# Patient Record
Sex: Female | Born: 1940
Health system: Southern US, Community
[De-identification: ages and names within clinical notes are randomized; demographics above are authoritative.]

## PROBLEM LIST (undated history)

## (undated) HISTORY — PX: BREAST EXCISIONAL BIOPSY: SUR124

---

## 2013-10-04 DIAGNOSIS — L219 Seborrheic dermatitis, unspecified: Secondary | ICD-10-CM | POA: Diagnosis not present

## 2013-10-04 DIAGNOSIS — L821 Other seborrheic keratosis: Secondary | ICD-10-CM | POA: Diagnosis not present

## 2013-10-04 DIAGNOSIS — L259 Unspecified contact dermatitis, unspecified cause: Secondary | ICD-10-CM | POA: Diagnosis not present

## 2013-11-09 DIAGNOSIS — H34219 Partial retinal artery occlusion, unspecified eye: Secondary | ICD-10-CM | POA: Diagnosis not present

## 2013-11-09 DIAGNOSIS — H4011X0 Primary open-angle glaucoma, stage unspecified: Secondary | ICD-10-CM | POA: Diagnosis not present

## 2013-12-20 DIAGNOSIS — Z1231 Encounter for screening mammogram for malignant neoplasm of breast: Secondary | ICD-10-CM | POA: Diagnosis not present

## 2013-12-29 DIAGNOSIS — R635 Abnormal weight gain: Secondary | ICD-10-CM | POA: Diagnosis not present

## 2013-12-29 DIAGNOSIS — I10 Essential (primary) hypertension: Secondary | ICD-10-CM | POA: Diagnosis not present

## 2013-12-29 DIAGNOSIS — J309 Allergic rhinitis, unspecified: Secondary | ICD-10-CM | POA: Diagnosis not present

## 2013-12-29 DIAGNOSIS — Z23 Encounter for immunization: Secondary | ICD-10-CM | POA: Diagnosis not present

## 2013-12-29 DIAGNOSIS — H409 Unspecified glaucoma: Secondary | ICD-10-CM | POA: Diagnosis not present

## 2013-12-29 DIAGNOSIS — L299 Pruritus, unspecified: Secondary | ICD-10-CM | POA: Diagnosis not present

## 2013-12-29 DIAGNOSIS — M899 Disorder of bone, unspecified: Secondary | ICD-10-CM | POA: Diagnosis not present

## 2013-12-29 DIAGNOSIS — M949 Disorder of cartilage, unspecified: Secondary | ICD-10-CM | POA: Diagnosis not present

## 2014-01-04 DIAGNOSIS — Z780 Asymptomatic menopausal state: Secondary | ICD-10-CM | POA: Diagnosis not present

## 2014-01-04 DIAGNOSIS — Z13820 Encounter for screening for osteoporosis: Secondary | ICD-10-CM | POA: Diagnosis not present

## 2014-01-04 DIAGNOSIS — M949 Disorder of cartilage, unspecified: Secondary | ICD-10-CM | POA: Diagnosis not present

## 2014-01-04 DIAGNOSIS — Z8269 Family history of other diseases of the musculoskeletal system and connective tissue: Secondary | ICD-10-CM | POA: Diagnosis not present

## 2014-01-04 DIAGNOSIS — M899 Disorder of bone, unspecified: Secondary | ICD-10-CM | POA: Diagnosis not present

## 2014-01-11 DIAGNOSIS — H4011X0 Primary open-angle glaucoma, stage unspecified: Secondary | ICD-10-CM | POA: Diagnosis not present

## 2014-01-11 DIAGNOSIS — H5210 Myopia, unspecified eye: Secondary | ICD-10-CM | POA: Diagnosis not present

## 2014-01-11 DIAGNOSIS — D3100 Benign neoplasm of unspecified conjunctiva: Secondary | ICD-10-CM | POA: Diagnosis not present

## 2014-01-14 DIAGNOSIS — M67919 Unspecified disorder of synovium and tendon, unspecified shoulder: Secondary | ICD-10-CM | POA: Diagnosis not present

## 2014-01-14 DIAGNOSIS — M25519 Pain in unspecified shoulder: Secondary | ICD-10-CM | POA: Diagnosis not present

## 2014-01-14 DIAGNOSIS — IMO0002 Reserved for concepts with insufficient information to code with codable children: Secondary | ICD-10-CM | POA: Diagnosis not present

## 2014-01-14 DIAGNOSIS — M719 Bursopathy, unspecified: Secondary | ICD-10-CM | POA: Diagnosis not present

## 2014-01-14 DIAGNOSIS — M19019 Primary osteoarthritis, unspecified shoulder: Secondary | ICD-10-CM | POA: Diagnosis not present

## 2014-01-17 DIAGNOSIS — IMO0002 Reserved for concepts with insufficient information to code with codable children: Secondary | ICD-10-CM | POA: Diagnosis not present

## 2014-01-17 DIAGNOSIS — M25519 Pain in unspecified shoulder: Secondary | ICD-10-CM | POA: Diagnosis not present

## 2014-01-19 DIAGNOSIS — IMO0002 Reserved for concepts with insufficient information to code with codable children: Secondary | ICD-10-CM | POA: Diagnosis not present

## 2014-01-19 DIAGNOSIS — M25519 Pain in unspecified shoulder: Secondary | ICD-10-CM | POA: Diagnosis not present

## 2014-01-24 DIAGNOSIS — M25519 Pain in unspecified shoulder: Secondary | ICD-10-CM | POA: Diagnosis not present

## 2014-01-24 DIAGNOSIS — IMO0002 Reserved for concepts with insufficient information to code with codable children: Secondary | ICD-10-CM | POA: Diagnosis not present

## 2014-02-07 DIAGNOSIS — M25519 Pain in unspecified shoulder: Secondary | ICD-10-CM | POA: Diagnosis not present

## 2014-02-07 DIAGNOSIS — IMO0002 Reserved for concepts with insufficient information to code with codable children: Secondary | ICD-10-CM | POA: Diagnosis not present

## 2014-02-11 DIAGNOSIS — IMO0002 Reserved for concepts with insufficient information to code with codable children: Secondary | ICD-10-CM | POA: Diagnosis not present

## 2014-02-11 DIAGNOSIS — M25519 Pain in unspecified shoulder: Secondary | ICD-10-CM | POA: Diagnosis not present

## 2014-02-14 DIAGNOSIS — IMO0002 Reserved for concepts with insufficient information to code with codable children: Secondary | ICD-10-CM | POA: Diagnosis not present

## 2014-02-14 DIAGNOSIS — M25519 Pain in unspecified shoulder: Secondary | ICD-10-CM | POA: Diagnosis not present

## 2014-02-18 DIAGNOSIS — IMO0002 Reserved for concepts with insufficient information to code with codable children: Secondary | ICD-10-CM | POA: Diagnosis not present

## 2014-02-18 DIAGNOSIS — M25519 Pain in unspecified shoulder: Secondary | ICD-10-CM | POA: Diagnosis not present

## 2014-02-23 DIAGNOSIS — IMO0002 Reserved for concepts with insufficient information to code with codable children: Secondary | ICD-10-CM | POA: Diagnosis not present

## 2014-02-23 DIAGNOSIS — M25519 Pain in unspecified shoulder: Secondary | ICD-10-CM | POA: Diagnosis not present

## 2014-02-28 DIAGNOSIS — IMO0002 Reserved for concepts with insufficient information to code with codable children: Secondary | ICD-10-CM | POA: Diagnosis not present

## 2014-02-28 DIAGNOSIS — M25519 Pain in unspecified shoulder: Secondary | ICD-10-CM | POA: Diagnosis not present

## 2014-03-03 DIAGNOSIS — IMO0002 Reserved for concepts with insufficient information to code with codable children: Secondary | ICD-10-CM | POA: Diagnosis not present

## 2014-03-03 DIAGNOSIS — M25519 Pain in unspecified shoulder: Secondary | ICD-10-CM | POA: Diagnosis not present

## 2014-03-07 DIAGNOSIS — IMO0002 Reserved for concepts with insufficient information to code with codable children: Secondary | ICD-10-CM | POA: Diagnosis not present

## 2014-03-07 DIAGNOSIS — M25519 Pain in unspecified shoulder: Secondary | ICD-10-CM | POA: Diagnosis not present

## 2014-03-09 DIAGNOSIS — M25519 Pain in unspecified shoulder: Secondary | ICD-10-CM | POA: Diagnosis not present

## 2014-03-09 DIAGNOSIS — IMO0002 Reserved for concepts with insufficient information to code with codable children: Secondary | ICD-10-CM | POA: Diagnosis not present

## 2014-03-14 DIAGNOSIS — IMO0002 Reserved for concepts with insufficient information to code with codable children: Secondary | ICD-10-CM | POA: Diagnosis not present

## 2014-03-14 DIAGNOSIS — M25519 Pain in unspecified shoulder: Secondary | ICD-10-CM | POA: Diagnosis not present

## 2014-03-16 DIAGNOSIS — M25519 Pain in unspecified shoulder: Secondary | ICD-10-CM | POA: Diagnosis not present

## 2014-03-16 DIAGNOSIS — IMO0002 Reserved for concepts with insufficient information to code with codable children: Secondary | ICD-10-CM | POA: Diagnosis not present

## 2014-03-21 DIAGNOSIS — L821 Other seborrheic keratosis: Secondary | ICD-10-CM | POA: Diagnosis not present

## 2014-03-21 DIAGNOSIS — D1801 Hemangioma of skin and subcutaneous tissue: Secondary | ICD-10-CM | POA: Diagnosis not present

## 2014-03-21 DIAGNOSIS — L258 Unspecified contact dermatitis due to other agents: Secondary | ICD-10-CM | POA: Diagnosis not present

## 2014-03-21 DIAGNOSIS — Z85820 Personal history of malignant melanoma of skin: Secondary | ICD-10-CM | POA: Diagnosis not present

## 2014-03-21 DIAGNOSIS — L570 Actinic keratosis: Secondary | ICD-10-CM | POA: Diagnosis not present

## 2014-03-21 DIAGNOSIS — D485 Neoplasm of uncertain behavior of skin: Secondary | ICD-10-CM | POA: Diagnosis not present

## 2014-03-22 DIAGNOSIS — H53429 Scotoma of blind spot area, unspecified eye: Secondary | ICD-10-CM | POA: Diagnosis not present

## 2014-03-22 DIAGNOSIS — H4011X0 Primary open-angle glaucoma, stage unspecified: Secondary | ICD-10-CM | POA: Diagnosis not present

## 2014-03-22 DIAGNOSIS — H35039 Hypertensive retinopathy, unspecified eye: Secondary | ICD-10-CM | POA: Diagnosis not present

## 2014-03-22 DIAGNOSIS — H359 Unspecified retinal disorder: Secondary | ICD-10-CM | POA: Diagnosis not present

## 2014-03-23 DIAGNOSIS — S43429A Sprain of unspecified rotator cuff capsule, initial encounter: Secondary | ICD-10-CM | POA: Diagnosis not present

## 2014-03-29 DIAGNOSIS — L570 Actinic keratosis: Secondary | ICD-10-CM | POA: Diagnosis not present

## 2014-03-29 DIAGNOSIS — L678 Other hair color and hair shaft abnormalities: Secondary | ICD-10-CM | POA: Diagnosis not present

## 2014-03-29 DIAGNOSIS — L738 Other specified follicular disorders: Secondary | ICD-10-CM | POA: Diagnosis not present

## 2014-04-08 DIAGNOSIS — S46819A Strain of other muscles, fascia and tendons at shoulder and upper arm level, unspecified arm, initial encounter: Secondary | ICD-10-CM | POA: Diagnosis not present

## 2014-04-13 DIAGNOSIS — Z01818 Encounter for other preprocedural examination: Secondary | ICD-10-CM | POA: Diagnosis not present

## 2014-04-13 DIAGNOSIS — M25819 Other specified joint disorders, unspecified shoulder: Secondary | ICD-10-CM | POA: Diagnosis not present

## 2014-04-13 DIAGNOSIS — M19019 Primary osteoarthritis, unspecified shoulder: Secondary | ICD-10-CM | POA: Diagnosis not present

## 2014-04-13 DIAGNOSIS — S43429A Sprain of unspecified rotator cuff capsule, initial encounter: Secondary | ICD-10-CM | POA: Diagnosis not present

## 2014-04-15 DIAGNOSIS — S43429A Sprain of unspecified rotator cuff capsule, initial encounter: Secondary | ICD-10-CM | POA: Diagnosis not present

## 2014-04-15 DIAGNOSIS — M25819 Other specified joint disorders, unspecified shoulder: Secondary | ICD-10-CM | POA: Diagnosis not present

## 2014-04-15 DIAGNOSIS — M25519 Pain in unspecified shoulder: Secondary | ICD-10-CM | POA: Diagnosis not present

## 2014-04-15 DIAGNOSIS — M19019 Primary osteoarthritis, unspecified shoulder: Secondary | ICD-10-CM | POA: Diagnosis not present

## 2014-04-19 DIAGNOSIS — R9431 Abnormal electrocardiogram [ECG] [EKG]: Secondary | ICD-10-CM | POA: Diagnosis not present

## 2014-04-19 DIAGNOSIS — Z01810 Encounter for preprocedural cardiovascular examination: Secondary | ICD-10-CM | POA: Diagnosis not present

## 2014-04-19 DIAGNOSIS — Z01818 Encounter for other preprocedural examination: Secondary | ICD-10-CM | POA: Diagnosis not present

## 2014-04-19 DIAGNOSIS — S43429A Sprain of unspecified rotator cuff capsule, initial encounter: Secondary | ICD-10-CM | POA: Diagnosis not present

## 2014-04-21 DIAGNOSIS — S43439A Superior glenoid labrum lesion of unspecified shoulder, initial encounter: Secondary | ICD-10-CM | POA: Diagnosis not present

## 2014-04-21 DIAGNOSIS — M75120 Complete rotator cuff tear or rupture of unspecified shoulder, not specified as traumatic: Secondary | ICD-10-CM | POA: Diagnosis not present

## 2014-04-21 DIAGNOSIS — M24019 Loose body in unspecified shoulder: Secondary | ICD-10-CM | POA: Diagnosis not present

## 2014-04-21 DIAGNOSIS — M19019 Primary osteoarthritis, unspecified shoulder: Secondary | ICD-10-CM | POA: Diagnosis not present

## 2014-04-21 DIAGNOSIS — S43429A Sprain of unspecified rotator cuff capsule, initial encounter: Secondary | ICD-10-CM | POA: Diagnosis not present

## 2014-04-21 DIAGNOSIS — M25819 Other specified joint disorders, unspecified shoulder: Secondary | ICD-10-CM | POA: Diagnosis not present

## 2014-04-21 DIAGNOSIS — G8918 Other acute postprocedural pain: Secondary | ICD-10-CM | POA: Diagnosis not present

## 2014-04-27 DIAGNOSIS — S43429A Sprain of unspecified rotator cuff capsule, initial encounter: Secondary | ICD-10-CM | POA: Diagnosis not present

## 2014-04-27 DIAGNOSIS — M25519 Pain in unspecified shoulder: Secondary | ICD-10-CM | POA: Diagnosis not present

## 2014-05-02 DIAGNOSIS — M25519 Pain in unspecified shoulder: Secondary | ICD-10-CM | POA: Diagnosis not present

## 2014-05-02 DIAGNOSIS — S43429A Sprain of unspecified rotator cuff capsule, initial encounter: Secondary | ICD-10-CM | POA: Diagnosis not present

## 2014-05-06 DIAGNOSIS — S43429A Sprain of unspecified rotator cuff capsule, initial encounter: Secondary | ICD-10-CM | POA: Diagnosis not present

## 2014-05-06 DIAGNOSIS — M25519 Pain in unspecified shoulder: Secondary | ICD-10-CM | POA: Diagnosis not present

## 2014-05-10 DIAGNOSIS — M25519 Pain in unspecified shoulder: Secondary | ICD-10-CM | POA: Diagnosis not present

## 2014-05-10 DIAGNOSIS — S43429A Sprain of unspecified rotator cuff capsule, initial encounter: Secondary | ICD-10-CM | POA: Diagnosis not present

## 2014-05-12 DIAGNOSIS — S43429A Sprain of unspecified rotator cuff capsule, initial encounter: Secondary | ICD-10-CM | POA: Diagnosis not present

## 2014-05-12 DIAGNOSIS — M25519 Pain in unspecified shoulder: Secondary | ICD-10-CM | POA: Diagnosis not present

## 2014-05-16 DIAGNOSIS — M25519 Pain in unspecified shoulder: Secondary | ICD-10-CM | POA: Diagnosis not present

## 2014-05-16 DIAGNOSIS — S43429A Sprain of unspecified rotator cuff capsule, initial encounter: Secondary | ICD-10-CM | POA: Diagnosis not present

## 2014-05-19 DIAGNOSIS — M25519 Pain in unspecified shoulder: Secondary | ICD-10-CM | POA: Diagnosis not present

## 2014-05-19 DIAGNOSIS — S43429A Sprain of unspecified rotator cuff capsule, initial encounter: Secondary | ICD-10-CM | POA: Diagnosis not present

## 2014-05-19 DIAGNOSIS — L570 Actinic keratosis: Secondary | ICD-10-CM | POA: Diagnosis not present

## 2014-05-23 DIAGNOSIS — S43429A Sprain of unspecified rotator cuff capsule, initial encounter: Secondary | ICD-10-CM | POA: Diagnosis not present

## 2014-05-23 DIAGNOSIS — M25519 Pain in unspecified shoulder: Secondary | ICD-10-CM | POA: Diagnosis not present

## 2014-06-07 DIAGNOSIS — S43429A Sprain of unspecified rotator cuff capsule, initial encounter: Secondary | ICD-10-CM | POA: Diagnosis not present

## 2014-06-07 DIAGNOSIS — M25519 Pain in unspecified shoulder: Secondary | ICD-10-CM | POA: Diagnosis not present

## 2014-06-09 DIAGNOSIS — M25519 Pain in unspecified shoulder: Secondary | ICD-10-CM | POA: Diagnosis not present

## 2014-06-09 DIAGNOSIS — S43429A Sprain of unspecified rotator cuff capsule, initial encounter: Secondary | ICD-10-CM | POA: Diagnosis not present

## 2014-06-14 DIAGNOSIS — S43429A Sprain of unspecified rotator cuff capsule, initial encounter: Secondary | ICD-10-CM | POA: Diagnosis not present

## 2014-06-14 DIAGNOSIS — M25519 Pain in unspecified shoulder: Secondary | ICD-10-CM | POA: Diagnosis not present

## 2014-06-16 DIAGNOSIS — M25519 Pain in unspecified shoulder: Secondary | ICD-10-CM | POA: Diagnosis not present

## 2014-06-16 DIAGNOSIS — S43429A Sprain of unspecified rotator cuff capsule, initial encounter: Secondary | ICD-10-CM | POA: Diagnosis not present

## 2014-06-17 DIAGNOSIS — Z1211 Encounter for screening for malignant neoplasm of colon: Secondary | ICD-10-CM | POA: Diagnosis not present

## 2014-06-17 DIAGNOSIS — Z5181 Encounter for therapeutic drug level monitoring: Secondary | ICD-10-CM | POA: Diagnosis not present

## 2014-06-17 DIAGNOSIS — D126 Benign neoplasm of colon, unspecified: Secondary | ICD-10-CM | POA: Diagnosis not present

## 2014-06-17 DIAGNOSIS — K5289 Other specified noninfective gastroenteritis and colitis: Secondary | ICD-10-CM | POA: Diagnosis not present

## 2014-06-17 DIAGNOSIS — K5190 Ulcerative colitis, unspecified, without complications: Secondary | ICD-10-CM | POA: Diagnosis not present

## 2014-06-21 DIAGNOSIS — M25519 Pain in unspecified shoulder: Secondary | ICD-10-CM | POA: Diagnosis not present

## 2014-06-21 DIAGNOSIS — S43429A Sprain of unspecified rotator cuff capsule, initial encounter: Secondary | ICD-10-CM | POA: Diagnosis not present

## 2014-06-22 DIAGNOSIS — N3000 Acute cystitis without hematuria: Secondary | ICD-10-CM | POA: Diagnosis not present

## 2014-06-22 DIAGNOSIS — R300 Dysuria: Secondary | ICD-10-CM | POA: Diagnosis not present

## 2014-06-22 DIAGNOSIS — Z23 Encounter for immunization: Secondary | ICD-10-CM | POA: Diagnosis not present

## 2014-06-23 DIAGNOSIS — S43429A Sprain of unspecified rotator cuff capsule, initial encounter: Secondary | ICD-10-CM | POA: Diagnosis not present

## 2014-06-23 DIAGNOSIS — M25519 Pain in unspecified shoulder: Secondary | ICD-10-CM | POA: Diagnosis not present

## 2014-06-28 DIAGNOSIS — S43429A Sprain of unspecified rotator cuff capsule, initial encounter: Secondary | ICD-10-CM | POA: Diagnosis not present

## 2014-06-28 DIAGNOSIS — M25519 Pain in unspecified shoulder: Secondary | ICD-10-CM | POA: Diagnosis not present

## 2014-06-28 DIAGNOSIS — IMO0002 Reserved for concepts with insufficient information to code with codable children: Secondary | ICD-10-CM | POA: Diagnosis not present

## 2014-06-29 DIAGNOSIS — M654 Radial styloid tenosynovitis [de Quervain]: Secondary | ICD-10-CM | POA: Diagnosis not present

## 2014-06-29 DIAGNOSIS — M19049 Primary osteoarthritis, unspecified hand: Secondary | ICD-10-CM | POA: Diagnosis not present

## 2014-06-30 DIAGNOSIS — M25512 Pain in left shoulder: Secondary | ICD-10-CM | POA: Diagnosis not present

## 2014-06-30 DIAGNOSIS — S43422A Sprain of left rotator cuff capsule, initial encounter: Secondary | ICD-10-CM | POA: Diagnosis not present

## 2014-07-12 DIAGNOSIS — M25512 Pain in left shoulder: Secondary | ICD-10-CM | POA: Diagnosis not present

## 2014-07-12 DIAGNOSIS — S43422D Sprain of left rotator cuff capsule, subsequent encounter: Secondary | ICD-10-CM | POA: Diagnosis not present

## 2014-07-13 DIAGNOSIS — H4011X2 Primary open-angle glaucoma, moderate stage: Secondary | ICD-10-CM | POA: Diagnosis not present

## 2014-07-15 DIAGNOSIS — S43422D Sprain of left rotator cuff capsule, subsequent encounter: Secondary | ICD-10-CM | POA: Diagnosis not present

## 2014-07-15 DIAGNOSIS — M25512 Pain in left shoulder: Secondary | ICD-10-CM | POA: Diagnosis not present

## 2014-07-18 DIAGNOSIS — S43422D Sprain of left rotator cuff capsule, subsequent encounter: Secondary | ICD-10-CM | POA: Diagnosis not present

## 2014-07-18 DIAGNOSIS — M25512 Pain in left shoulder: Secondary | ICD-10-CM | POA: Diagnosis not present

## 2014-07-18 DIAGNOSIS — S43422A Sprain of left rotator cuff capsule, initial encounter: Secondary | ICD-10-CM | POA: Diagnosis not present

## 2014-07-26 DIAGNOSIS — S43422D Sprain of left rotator cuff capsule, subsequent encounter: Secondary | ICD-10-CM | POA: Diagnosis not present

## 2014-07-26 DIAGNOSIS — M25512 Pain in left shoulder: Secondary | ICD-10-CM | POA: Diagnosis not present

## 2014-07-28 DIAGNOSIS — M25512 Pain in left shoulder: Secondary | ICD-10-CM | POA: Diagnosis not present

## 2014-07-28 DIAGNOSIS — S43422D Sprain of left rotator cuff capsule, subsequent encounter: Secondary | ICD-10-CM | POA: Diagnosis not present

## 2014-08-08 DIAGNOSIS — S43422D Sprain of left rotator cuff capsule, subsequent encounter: Secondary | ICD-10-CM | POA: Diagnosis not present

## 2014-08-08 DIAGNOSIS — M25512 Pain in left shoulder: Secondary | ICD-10-CM | POA: Diagnosis not present

## 2014-08-10 DIAGNOSIS — M25512 Pain in left shoulder: Secondary | ICD-10-CM | POA: Diagnosis not present

## 2014-08-10 DIAGNOSIS — S43422D Sprain of left rotator cuff capsule, subsequent encounter: Secondary | ICD-10-CM | POA: Diagnosis not present

## 2014-08-16 DIAGNOSIS — M25512 Pain in left shoulder: Secondary | ICD-10-CM | POA: Diagnosis not present

## 2014-08-16 DIAGNOSIS — S43422D Sprain of left rotator cuff capsule, subsequent encounter: Secondary | ICD-10-CM | POA: Diagnosis not present

## 2014-08-17 DIAGNOSIS — M25512 Pain in left shoulder: Secondary | ICD-10-CM | POA: Diagnosis not present

## 2014-08-17 DIAGNOSIS — S43422D Sprain of left rotator cuff capsule, subsequent encounter: Secondary | ICD-10-CM | POA: Diagnosis not present

## 2014-09-16 DIAGNOSIS — L570 Actinic keratosis: Secondary | ICD-10-CM | POA: Diagnosis not present

## 2014-09-16 DIAGNOSIS — D1801 Hemangioma of skin and subcutaneous tissue: Secondary | ICD-10-CM | POA: Diagnosis not present

## 2014-09-16 DIAGNOSIS — Z08 Encounter for follow-up examination after completed treatment for malignant neoplasm: Secondary | ICD-10-CM | POA: Diagnosis not present

## 2014-09-16 DIAGNOSIS — L218 Other seborrheic dermatitis: Secondary | ICD-10-CM | POA: Diagnosis not present

## 2014-09-16 DIAGNOSIS — L680 Hirsutism: Secondary | ICD-10-CM | POA: Diagnosis not present

## 2014-10-17 DIAGNOSIS — M25511 Pain in right shoulder: Secondary | ICD-10-CM | POA: Diagnosis not present

## 2014-10-17 DIAGNOSIS — M19011 Primary osteoarthritis, right shoulder: Secondary | ICD-10-CM | POA: Diagnosis not present

## 2014-10-17 DIAGNOSIS — S43422D Sprain of left rotator cuff capsule, subsequent encounter: Secondary | ICD-10-CM | POA: Diagnosis not present

## 2014-10-17 DIAGNOSIS — Z09 Encounter for follow-up examination after completed treatment for conditions other than malignant neoplasm: Secondary | ICD-10-CM | POA: Diagnosis not present

## 2014-11-28 DIAGNOSIS — H918X2 Other specified hearing loss, left ear: Secondary | ICD-10-CM | POA: Diagnosis not present

## 2014-11-28 DIAGNOSIS — H9312 Tinnitus, left ear: Secondary | ICD-10-CM | POA: Diagnosis not present

## 2014-11-28 DIAGNOSIS — H903 Sensorineural hearing loss, bilateral: Secondary | ICD-10-CM | POA: Diagnosis not present

## 2014-11-28 DIAGNOSIS — H608X3 Other otitis externa, bilateral: Secondary | ICD-10-CM | POA: Diagnosis not present

## 2014-11-28 DIAGNOSIS — H905 Unspecified sensorineural hearing loss: Secondary | ICD-10-CM | POA: Diagnosis not present

## 2014-12-15 DIAGNOSIS — H905 Unspecified sensorineural hearing loss: Secondary | ICD-10-CM | POA: Diagnosis not present

## 2014-12-16 ENCOUNTER — Other Ambulatory Visit: Payer: Self-pay | Admitting: Otolaryngology

## 2014-12-16 DIAGNOSIS — H905 Unspecified sensorineural hearing loss: Secondary | ICD-10-CM

## 2014-12-16 DIAGNOSIS — H903 Sensorineural hearing loss, bilateral: Secondary | ICD-10-CM

## 2014-12-20 DIAGNOSIS — H2512 Age-related nuclear cataract, left eye: Secondary | ICD-10-CM | POA: Diagnosis not present

## 2014-12-20 DIAGNOSIS — H524 Presbyopia: Secondary | ICD-10-CM | POA: Diagnosis not present

## 2014-12-20 DIAGNOSIS — H4011X2 Primary open-angle glaucoma, moderate stage: Secondary | ICD-10-CM | POA: Diagnosis not present

## 2014-12-30 DIAGNOSIS — R921 Mammographic calcification found on diagnostic imaging of breast: Secondary | ICD-10-CM | POA: Diagnosis not present

## 2014-12-30 DIAGNOSIS — E559 Vitamin D deficiency, unspecified: Secondary | ICD-10-CM | POA: Diagnosis not present

## 2014-12-30 DIAGNOSIS — Z0000 Encounter for general adult medical examination without abnormal findings: Secondary | ICD-10-CM | POA: Diagnosis not present

## 2014-12-30 DIAGNOSIS — Z9889 Other specified postprocedural states: Secondary | ICD-10-CM | POA: Diagnosis not present

## 2014-12-30 DIAGNOSIS — I10 Essential (primary) hypertension: Secondary | ICD-10-CM | POA: Diagnosis not present

## 2014-12-30 DIAGNOSIS — K529 Noninfective gastroenteritis and colitis, unspecified: Secondary | ICD-10-CM | POA: Diagnosis not present

## 2014-12-30 DIAGNOSIS — Z8639 Personal history of other endocrine, nutritional and metabolic disease: Secondary | ICD-10-CM | POA: Diagnosis not present

## 2014-12-30 DIAGNOSIS — Z1231 Encounter for screening mammogram for malignant neoplasm of breast: Secondary | ICD-10-CM | POA: Diagnosis not present

## 2014-12-31 DIAGNOSIS — Z882 Allergy status to sulfonamides status: Secondary | ICD-10-CM | POA: Diagnosis not present

## 2014-12-31 DIAGNOSIS — R079 Chest pain, unspecified: Secondary | ICD-10-CM | POA: Diagnosis not present

## 2014-12-31 DIAGNOSIS — Z87891 Personal history of nicotine dependence: Secondary | ICD-10-CM | POA: Diagnosis not present

## 2014-12-31 DIAGNOSIS — Z85828 Personal history of other malignant neoplasm of skin: Secondary | ICD-10-CM | POA: Diagnosis not present

## 2014-12-31 DIAGNOSIS — I10 Essential (primary) hypertension: Secondary | ICD-10-CM | POA: Diagnosis not present

## 2014-12-31 DIAGNOSIS — R1031 Right lower quadrant pain: Secondary | ICD-10-CM | POA: Diagnosis not present

## 2014-12-31 DIAGNOSIS — R109 Unspecified abdominal pain: Secondary | ICD-10-CM | POA: Diagnosis not present

## 2014-12-31 DIAGNOSIS — S20212A Contusion of left front wall of thorax, initial encounter: Secondary | ICD-10-CM | POA: Diagnosis not present

## 2014-12-31 DIAGNOSIS — T1490 Injury, unspecified: Secondary | ICD-10-CM | POA: Diagnosis not present

## 2014-12-31 DIAGNOSIS — Z888 Allergy status to other drugs, medicaments and biological substances status: Secondary | ICD-10-CM | POA: Diagnosis not present

## 2014-12-31 DIAGNOSIS — Z91040 Latex allergy status: Secondary | ICD-10-CM | POA: Diagnosis not present

## 2014-12-31 DIAGNOSIS — R0781 Pleurodynia: Secondary | ICD-10-CM | POA: Diagnosis not present

## 2014-12-31 DIAGNOSIS — S2002XA Contusion of left breast, initial encounter: Secondary | ICD-10-CM | POA: Diagnosis not present

## 2014-12-31 DIAGNOSIS — R1012 Left upper quadrant pain: Secondary | ICD-10-CM | POA: Diagnosis not present

## 2015-01-06 ENCOUNTER — Ambulatory Visit
Admission: RE | Admit: 2015-01-06 | Discharge: 2015-01-06 | Disposition: A | Discharge status: Home / Self Care | Payer: Medicare Other | Source: Ambulatory Visit | Attending: Otolaryngology | Admitting: Otolaryngology

## 2015-01-06 DIAGNOSIS — H905 Unspecified sensorineural hearing loss: Secondary | ICD-10-CM

## 2015-01-06 DIAGNOSIS — H903 Sensorineural hearing loss, bilateral: Secondary | ICD-10-CM

## 2015-01-06 DIAGNOSIS — H9192 Unspecified hearing loss, left ear: Secondary | ICD-10-CM | POA: Diagnosis not present

## 2015-01-06 MED ORDER — GADOBENATE DIMEGLUMINE 529 MG/ML IV SOLN
7.0000 mL | Freq: Once | INTRAVENOUS | Status: AC | PRN
  Administered 2015-01-06: 7 mL via INTRAVENOUS

## 2015-02-24 DIAGNOSIS — K635 Polyp of colon: Secondary | ICD-10-CM | POA: Diagnosis not present

## 2015-02-24 DIAGNOSIS — I10 Essential (primary) hypertension: Secondary | ICD-10-CM | POA: Diagnosis not present

## 2015-02-24 DIAGNOSIS — Z85820 Personal history of malignant melanoma of skin: Secondary | ICD-10-CM | POA: Diagnosis not present

## 2015-02-24 DIAGNOSIS — H409 Unspecified glaucoma: Secondary | ICD-10-CM | POA: Diagnosis not present

## 2015-03-09 DIAGNOSIS — D2372 Other benign neoplasm of skin of left lower limb, including hip: Secondary | ICD-10-CM | POA: Diagnosis not present

## 2015-03-09 DIAGNOSIS — D1801 Hemangioma of skin and subcutaneous tissue: Secondary | ICD-10-CM | POA: Diagnosis not present

## 2015-03-09 DIAGNOSIS — L812 Freckles: Secondary | ICD-10-CM | POA: Diagnosis not present

## 2015-03-09 DIAGNOSIS — L218 Other seborrheic dermatitis: Secondary | ICD-10-CM | POA: Diagnosis not present

## 2015-03-09 DIAGNOSIS — L821 Other seborrheic keratosis: Secondary | ICD-10-CM | POA: Diagnosis not present

## 2015-03-09 DIAGNOSIS — Z85820 Personal history of malignant melanoma of skin: Secondary | ICD-10-CM | POA: Diagnosis not present

## 2015-03-09 DIAGNOSIS — L680 Hirsutism: Secondary | ICD-10-CM | POA: Diagnosis not present

## 2015-03-28 DIAGNOSIS — H4011X2 Primary open-angle glaucoma, moderate stage: Secondary | ICD-10-CM | POA: Diagnosis not present

## 2015-03-31 DIAGNOSIS — K5790 Diverticulosis of intestine, part unspecified, without perforation or abscess without bleeding: Secondary | ICD-10-CM | POA: Diagnosis not present

## 2015-03-31 DIAGNOSIS — R1032 Left lower quadrant pain: Secondary | ICD-10-CM | POA: Diagnosis not present

## 2015-06-26 DIAGNOSIS — Z23 Encounter for immunization: Secondary | ICD-10-CM | POA: Diagnosis not present

## 2015-07-03 DIAGNOSIS — H43391 Other vitreous opacities, right eye: Secondary | ICD-10-CM | POA: Diagnosis not present

## 2015-07-03 DIAGNOSIS — B5800 Toxoplasma oculopathy, unspecified: Secondary | ICD-10-CM | POA: Diagnosis not present

## 2015-07-07 DIAGNOSIS — N390 Urinary tract infection, site not specified: Secondary | ICD-10-CM | POA: Diagnosis not present

## 2015-07-07 DIAGNOSIS — R319 Hematuria, unspecified: Secondary | ICD-10-CM | POA: Diagnosis not present

## 2015-07-31 DIAGNOSIS — H401132 Primary open-angle glaucoma, bilateral, moderate stage: Secondary | ICD-10-CM | POA: Diagnosis not present

## 2015-08-30 DIAGNOSIS — Z1389 Encounter for screening for other disorder: Secondary | ICD-10-CM | POA: Diagnosis not present

## 2015-08-30 DIAGNOSIS — Z0000 Encounter for general adult medical examination without abnormal findings: Secondary | ICD-10-CM | POA: Diagnosis not present

## 2015-08-30 DIAGNOSIS — M12819 Other specific arthropathies, not elsewhere classified, unspecified shoulder: Secondary | ICD-10-CM | POA: Diagnosis not present

## 2015-08-30 DIAGNOSIS — Z136 Encounter for screening for cardiovascular disorders: Secondary | ICD-10-CM | POA: Diagnosis not present

## 2015-08-30 DIAGNOSIS — Z23 Encounter for immunization: Secondary | ICD-10-CM | POA: Diagnosis not present

## 2015-08-30 DIAGNOSIS — M179 Osteoarthritis of knee, unspecified: Secondary | ICD-10-CM | POA: Diagnosis not present

## 2015-08-30 DIAGNOSIS — I10 Essential (primary) hypertension: Secondary | ICD-10-CM | POA: Diagnosis not present

## 2015-09-01 DIAGNOSIS — L308 Other specified dermatitis: Secondary | ICD-10-CM | POA: Diagnosis not present

## 2015-09-01 DIAGNOSIS — D2261 Melanocytic nevi of right upper limb, including shoulder: Secondary | ICD-10-CM | POA: Diagnosis not present

## 2015-09-01 DIAGNOSIS — Z85820 Personal history of malignant melanoma of skin: Secondary | ICD-10-CM | POA: Diagnosis not present

## 2015-09-01 DIAGNOSIS — D1801 Hemangioma of skin and subcutaneous tissue: Secondary | ICD-10-CM | POA: Diagnosis not present

## 2015-09-01 DIAGNOSIS — L821 Other seborrheic keratosis: Secondary | ICD-10-CM | POA: Diagnosis not present

## 2015-09-01 DIAGNOSIS — L918 Other hypertrophic disorders of the skin: Secondary | ICD-10-CM | POA: Diagnosis not present

## 2015-09-01 DIAGNOSIS — L603 Nail dystrophy: Secondary | ICD-10-CM | POA: Diagnosis not present

## 2015-09-01 DIAGNOSIS — L245 Irritant contact dermatitis due to other chemical products: Secondary | ICD-10-CM | POA: Diagnosis not present

## 2015-09-01 DIAGNOSIS — L218 Other seborrheic dermatitis: Secondary | ICD-10-CM | POA: Diagnosis not present

## 2015-09-11 DIAGNOSIS — M25511 Pain in right shoulder: Secondary | ICD-10-CM | POA: Diagnosis not present

## 2015-09-11 DIAGNOSIS — M25561 Pain in right knee: Secondary | ICD-10-CM | POA: Diagnosis not present

## 2015-09-11 DIAGNOSIS — M25512 Pain in left shoulder: Secondary | ICD-10-CM | POA: Diagnosis not present

## 2015-09-11 DIAGNOSIS — M25562 Pain in left knee: Secondary | ICD-10-CM | POA: Diagnosis not present

## 2015-09-14 ENCOUNTER — Other Ambulatory Visit: Payer: Self-pay | Admitting: Internal Medicine

## 2015-09-14 DIAGNOSIS — M25512 Pain in left shoulder: Secondary | ICD-10-CM | POA: Diagnosis not present

## 2015-09-14 DIAGNOSIS — M25511 Pain in right shoulder: Secondary | ICD-10-CM | POA: Diagnosis not present

## 2015-09-14 DIAGNOSIS — M25562 Pain in left knee: Secondary | ICD-10-CM | POA: Diagnosis not present

## 2015-09-14 DIAGNOSIS — M79662 Pain in left lower leg: Secondary | ICD-10-CM

## 2015-09-14 DIAGNOSIS — M79661 Pain in right lower leg: Secondary | ICD-10-CM

## 2015-09-14 DIAGNOSIS — M25561 Pain in right knee: Secondary | ICD-10-CM | POA: Diagnosis not present

## 2015-09-15 DIAGNOSIS — M7122 Synovial cyst of popliteal space [Baker], left knee: Secondary | ICD-10-CM | POA: Diagnosis not present

## 2015-09-15 DIAGNOSIS — M7121 Synovial cyst of popliteal space [Baker], right knee: Secondary | ICD-10-CM | POA: Diagnosis not present

## 2015-09-18 DIAGNOSIS — M25511 Pain in right shoulder: Secondary | ICD-10-CM | POA: Diagnosis not present

## 2015-09-18 DIAGNOSIS — M25512 Pain in left shoulder: Secondary | ICD-10-CM | POA: Diagnosis not present

## 2015-09-18 DIAGNOSIS — M25561 Pain in right knee: Secondary | ICD-10-CM | POA: Diagnosis not present

## 2015-09-18 DIAGNOSIS — M25562 Pain in left knee: Secondary | ICD-10-CM | POA: Diagnosis not present

## 2015-09-20 ENCOUNTER — Other Ambulatory Visit: Payer: Medicare Other

## 2015-09-20 DIAGNOSIS — M25562 Pain in left knee: Secondary | ICD-10-CM | POA: Diagnosis not present

## 2015-09-20 DIAGNOSIS — M25561 Pain in right knee: Secondary | ICD-10-CM | POA: Diagnosis not present

## 2015-09-20 DIAGNOSIS — M25511 Pain in right shoulder: Secondary | ICD-10-CM | POA: Diagnosis not present

## 2015-09-20 DIAGNOSIS — M25512 Pain in left shoulder: Secondary | ICD-10-CM | POA: Diagnosis not present

## 2015-09-28 DIAGNOSIS — M25561 Pain in right knee: Secondary | ICD-10-CM | POA: Diagnosis not present

## 2015-09-28 DIAGNOSIS — M25562 Pain in left knee: Secondary | ICD-10-CM | POA: Diagnosis not present

## 2015-09-28 DIAGNOSIS — M25512 Pain in left shoulder: Secondary | ICD-10-CM | POA: Diagnosis not present

## 2015-09-28 DIAGNOSIS — M25511 Pain in right shoulder: Secondary | ICD-10-CM | POA: Diagnosis not present

## 2015-09-29 DIAGNOSIS — M25562 Pain in left knee: Secondary | ICD-10-CM | POA: Diagnosis not present

## 2015-09-29 DIAGNOSIS — M25511 Pain in right shoulder: Secondary | ICD-10-CM | POA: Diagnosis not present

## 2015-09-29 DIAGNOSIS — M25512 Pain in left shoulder: Secondary | ICD-10-CM | POA: Diagnosis not present

## 2015-09-29 DIAGNOSIS — M25561 Pain in right knee: Secondary | ICD-10-CM | POA: Diagnosis not present

## 2015-10-03 DIAGNOSIS — M25562 Pain in left knee: Secondary | ICD-10-CM | POA: Diagnosis not present

## 2015-10-03 DIAGNOSIS — M25511 Pain in right shoulder: Secondary | ICD-10-CM | POA: Diagnosis not present

## 2015-10-03 DIAGNOSIS — M25512 Pain in left shoulder: Secondary | ICD-10-CM | POA: Diagnosis not present

## 2015-10-03 DIAGNOSIS — M25561 Pain in right knee: Secondary | ICD-10-CM | POA: Diagnosis not present

## 2015-10-05 DIAGNOSIS — M25562 Pain in left knee: Secondary | ICD-10-CM | POA: Diagnosis not present

## 2015-10-05 DIAGNOSIS — M25511 Pain in right shoulder: Secondary | ICD-10-CM | POA: Diagnosis not present

## 2015-10-05 DIAGNOSIS — M25512 Pain in left shoulder: Secondary | ICD-10-CM | POA: Diagnosis not present

## 2015-10-05 DIAGNOSIS — M25561 Pain in right knee: Secondary | ICD-10-CM | POA: Diagnosis not present

## 2015-10-12 DIAGNOSIS — M25561 Pain in right knee: Secondary | ICD-10-CM | POA: Diagnosis not present

## 2015-10-12 DIAGNOSIS — M25512 Pain in left shoulder: Secondary | ICD-10-CM | POA: Diagnosis not present

## 2015-10-12 DIAGNOSIS — M25562 Pain in left knee: Secondary | ICD-10-CM | POA: Diagnosis not present

## 2015-10-12 DIAGNOSIS — M25511 Pain in right shoulder: Secondary | ICD-10-CM | POA: Diagnosis not present

## 2015-10-17 DIAGNOSIS — M25561 Pain in right knee: Secondary | ICD-10-CM | POA: Diagnosis not present

## 2015-10-17 DIAGNOSIS — M25512 Pain in left shoulder: Secondary | ICD-10-CM | POA: Diagnosis not present

## 2015-10-17 DIAGNOSIS — M25511 Pain in right shoulder: Secondary | ICD-10-CM | POA: Diagnosis not present

## 2015-10-17 DIAGNOSIS — M25562 Pain in left knee: Secondary | ICD-10-CM | POA: Diagnosis not present

## 2015-10-19 DIAGNOSIS — M25511 Pain in right shoulder: Secondary | ICD-10-CM | POA: Diagnosis not present

## 2015-10-19 DIAGNOSIS — M25562 Pain in left knee: Secondary | ICD-10-CM | POA: Diagnosis not present

## 2015-10-19 DIAGNOSIS — M25512 Pain in left shoulder: Secondary | ICD-10-CM | POA: Diagnosis not present

## 2015-10-19 DIAGNOSIS — M25561 Pain in right knee: Secondary | ICD-10-CM | POA: Diagnosis not present

## 2015-10-23 DIAGNOSIS — M25512 Pain in left shoulder: Secondary | ICD-10-CM | POA: Diagnosis not present

## 2015-10-23 DIAGNOSIS — M25562 Pain in left knee: Secondary | ICD-10-CM | POA: Diagnosis not present

## 2015-10-23 DIAGNOSIS — M25511 Pain in right shoulder: Secondary | ICD-10-CM | POA: Diagnosis not present

## 2015-10-23 DIAGNOSIS — M25561 Pain in right knee: Secondary | ICD-10-CM | POA: Diagnosis not present

## 2015-10-26 DIAGNOSIS — M25562 Pain in left knee: Secondary | ICD-10-CM | POA: Diagnosis not present

## 2015-10-26 DIAGNOSIS — M25561 Pain in right knee: Secondary | ICD-10-CM | POA: Diagnosis not present

## 2015-10-26 DIAGNOSIS — M25512 Pain in left shoulder: Secondary | ICD-10-CM | POA: Diagnosis not present

## 2015-10-26 DIAGNOSIS — M25511 Pain in right shoulder: Secondary | ICD-10-CM | POA: Diagnosis not present

## 2015-11-18 DIAGNOSIS — R05 Cough: Secondary | ICD-10-CM | POA: Diagnosis not present

## 2015-11-18 DIAGNOSIS — J3089 Other allergic rhinitis: Secondary | ICD-10-CM | POA: Diagnosis not present

## 2015-11-20 DIAGNOSIS — J209 Acute bronchitis, unspecified: Secondary | ICD-10-CM | POA: Diagnosis not present

## 2015-11-29 ENCOUNTER — Other Ambulatory Visit: Payer: Self-pay | Admitting: Internal Medicine

## 2015-11-29 DIAGNOSIS — M79602 Pain in left arm: Secondary | ICD-10-CM | POA: Diagnosis not present

## 2015-11-29 DIAGNOSIS — J209 Acute bronchitis, unspecified: Secondary | ICD-10-CM | POA: Diagnosis not present

## 2015-11-29 DIAGNOSIS — Z1231 Encounter for screening mammogram for malignant neoplasm of breast: Secondary | ICD-10-CM

## 2016-01-02 ENCOUNTER — Ambulatory Visit
Admission: RE | Admit: 2016-01-02 | Discharge: 2016-01-02 | Disposition: A | Discharge status: Home / Self Care | Payer: Medicare Other | Source: Ambulatory Visit | Attending: Internal Medicine | Admitting: Internal Medicine

## 2016-01-02 DIAGNOSIS — Z1231 Encounter for screening mammogram for malignant neoplasm of breast: Secondary | ICD-10-CM | POA: Diagnosis not present

## 2016-01-25 DIAGNOSIS — M75102 Unspecified rotator cuff tear or rupture of left shoulder, not specified as traumatic: Secondary | ICD-10-CM | POA: Diagnosis not present

## 2016-01-25 DIAGNOSIS — M7542 Impingement syndrome of left shoulder: Secondary | ICD-10-CM | POA: Diagnosis not present

## 2016-01-25 DIAGNOSIS — M25512 Pain in left shoulder: Secondary | ICD-10-CM | POA: Diagnosis not present

## 2016-01-25 DIAGNOSIS — M7522 Bicipital tendinitis, left shoulder: Secondary | ICD-10-CM | POA: Diagnosis not present

## 2016-02-05 DIAGNOSIS — M7542 Impingement syndrome of left shoulder: Secondary | ICD-10-CM | POA: Diagnosis not present

## 2016-02-08 DIAGNOSIS — M7542 Impingement syndrome of left shoulder: Secondary | ICD-10-CM | POA: Diagnosis not present

## 2016-02-12 DIAGNOSIS — M7542 Impingement syndrome of left shoulder: Secondary | ICD-10-CM | POA: Diagnosis not present

## 2016-02-14 DIAGNOSIS — M7542 Impingement syndrome of left shoulder: Secondary | ICD-10-CM | POA: Diagnosis not present

## 2016-02-15 DIAGNOSIS — M1812 Unilateral primary osteoarthritis of first carpometacarpal joint, left hand: Secondary | ICD-10-CM | POA: Diagnosis not present

## 2016-02-19 DIAGNOSIS — M7542 Impingement syndrome of left shoulder: Secondary | ICD-10-CM | POA: Diagnosis not present

## 2016-02-20 DIAGNOSIS — H401132 Primary open-angle glaucoma, bilateral, moderate stage: Secondary | ICD-10-CM | POA: Diagnosis not present

## 2016-02-21 DIAGNOSIS — M7542 Impingement syndrome of left shoulder: Secondary | ICD-10-CM | POA: Diagnosis not present

## 2016-02-22 DIAGNOSIS — M25512 Pain in left shoulder: Secondary | ICD-10-CM | POA: Diagnosis not present

## 2016-02-22 DIAGNOSIS — M25511 Pain in right shoulder: Secondary | ICD-10-CM | POA: Diagnosis not present

## 2016-02-22 DIAGNOSIS — M7541 Impingement syndrome of right shoulder: Secondary | ICD-10-CM | POA: Diagnosis not present

## 2016-02-22 DIAGNOSIS — M75102 Unspecified rotator cuff tear or rupture of left shoulder, not specified as traumatic: Secondary | ICD-10-CM | POA: Diagnosis not present

## 2016-02-27 DIAGNOSIS — I10 Essential (primary) hypertension: Secondary | ICD-10-CM | POA: Diagnosis not present

## 2016-02-27 DIAGNOSIS — Z8639 Personal history of other endocrine, nutritional and metabolic disease: Secondary | ICD-10-CM | POA: Diagnosis not present

## 2016-02-28 DIAGNOSIS — M7542 Impingement syndrome of left shoulder: Secondary | ICD-10-CM | POA: Diagnosis not present

## 2016-03-06 DIAGNOSIS — M7541 Impingement syndrome of right shoulder: Secondary | ICD-10-CM | POA: Diagnosis not present

## 2016-03-08 DIAGNOSIS — M7542 Impingement syndrome of left shoulder: Secondary | ICD-10-CM | POA: Diagnosis not present

## 2016-03-08 DIAGNOSIS — D2371 Other benign neoplasm of skin of right lower limb, including hip: Secondary | ICD-10-CM | POA: Diagnosis not present

## 2016-03-08 DIAGNOSIS — D1801 Hemangioma of skin and subcutaneous tissue: Secondary | ICD-10-CM | POA: Diagnosis not present

## 2016-03-08 DIAGNOSIS — L821 Other seborrheic keratosis: Secondary | ICD-10-CM | POA: Diagnosis not present

## 2016-03-08 DIAGNOSIS — D2261 Melanocytic nevi of right upper limb, including shoulder: Secondary | ICD-10-CM | POA: Diagnosis not present

## 2016-03-08 DIAGNOSIS — Z85820 Personal history of malignant melanoma of skin: Secondary | ICD-10-CM | POA: Diagnosis not present

## 2016-03-12 DIAGNOSIS — M7542 Impingement syndrome of left shoulder: Secondary | ICD-10-CM | POA: Diagnosis not present

## 2016-03-15 DIAGNOSIS — M7542 Impingement syndrome of left shoulder: Secondary | ICD-10-CM | POA: Diagnosis not present

## 2016-03-19 DIAGNOSIS — M7542 Impingement syndrome of left shoulder: Secondary | ICD-10-CM | POA: Diagnosis not present

## 2016-03-21 DIAGNOSIS — M25512 Pain in left shoulder: Secondary | ICD-10-CM | POA: Diagnosis not present

## 2016-03-21 DIAGNOSIS — M7541 Impingement syndrome of right shoulder: Secondary | ICD-10-CM | POA: Diagnosis not present

## 2016-03-21 DIAGNOSIS — M25511 Pain in right shoulder: Secondary | ICD-10-CM | POA: Diagnosis not present

## 2016-03-21 DIAGNOSIS — M7542 Impingement syndrome of left shoulder: Secondary | ICD-10-CM | POA: Diagnosis not present

## 2016-03-22 DIAGNOSIS — M7542 Impingement syndrome of left shoulder: Secondary | ICD-10-CM | POA: Diagnosis not present

## 2016-03-29 DIAGNOSIS — M7542 Impingement syndrome of left shoulder: Secondary | ICD-10-CM | POA: Diagnosis not present

## 2016-04-11 DIAGNOSIS — N390 Urinary tract infection, site not specified: Secondary | ICD-10-CM | POA: Diagnosis not present

## 2016-05-09 DIAGNOSIS — L218 Other seborrheic dermatitis: Secondary | ICD-10-CM | POA: Diagnosis not present

## 2016-05-21 DIAGNOSIS — H401132 Primary open-angle glaucoma, bilateral, moderate stage: Secondary | ICD-10-CM | POA: Diagnosis not present

## 2016-06-11 DIAGNOSIS — Z23 Encounter for immunization: Secondary | ICD-10-CM | POA: Diagnosis not present

## 2016-07-22 IMAGING — MR MR HEAD WO/W CM
10 of 11 series · 33 of 48 positions shown · IV contrast (multihance)
Comparison: None.

CLINICAL DATA: 73-year-old female with sided onset of left-sided
hearing loss. No tinnitus or dizziness. Initial encounter.

BUN and creatinine were obtained on site at [HOSPITAL] at
[HOSPITAL].
Results:  BUN 27 mg/dL,  Creatinine 1.3 mg/dL.
GFR 40 and therefore half dose contrast utilized.
EXAM:
MRI HEAD WITHOUT AND WITH CONTRAST
TECHNIQUE: Multiplanar, multiecho pulse sequences of the brain and surrounding
structures were obtained without and with intravenous contrast.
CONTRAST:  7mL MULTIHANCE GADOBENATE DIMEGLUMINE 529 MG/ML IV SOLN

[Series 2: T1 · sagittal · 5.0mm · 0.45mm/px · 4 of 21 slices shown (1 of 3)]
[im 1/21]
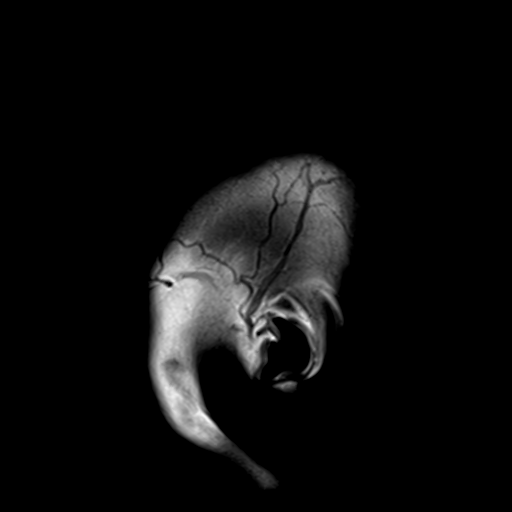
[im 7/21]
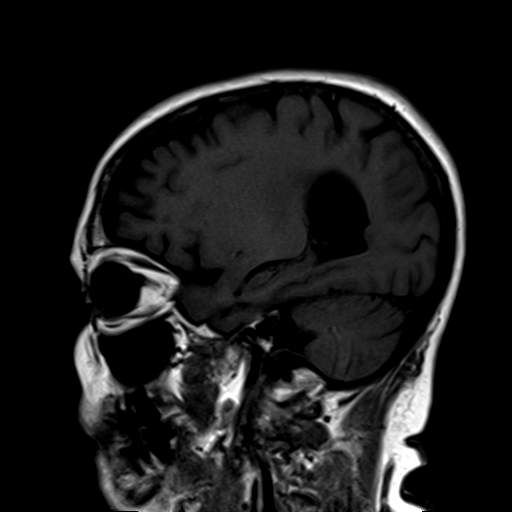
[im 14/21]
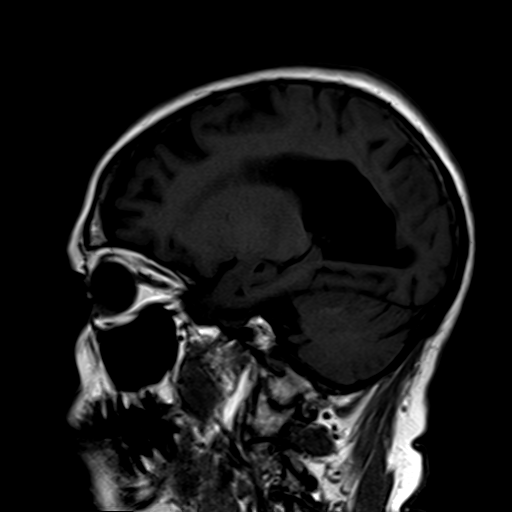
[im 21/21]
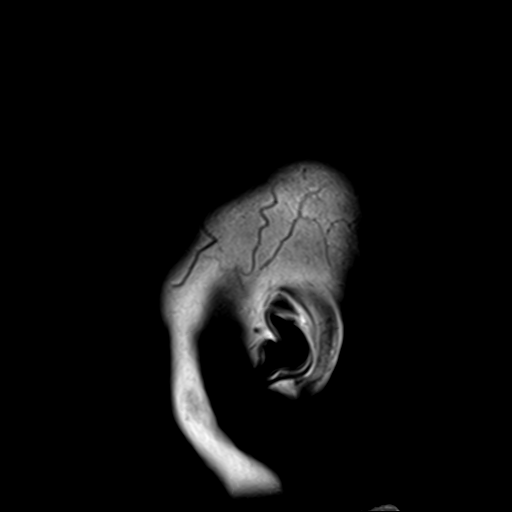

[Series 3: DWI · axial · 3.0mm · 1.80mm/px · z∈[-58,+89]mm · 8 of 100 slices shown (1 of 2)]
[im 1/100]
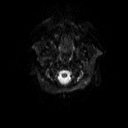
[im 16/100]
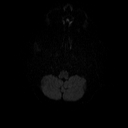
[im 31/100]
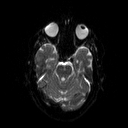
[im 46/100]
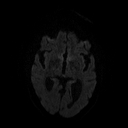
[im 54/100]
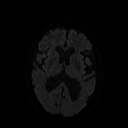
[im 69/100]
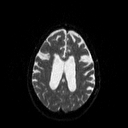
[im 84/100]
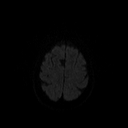
[im 100/100]
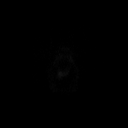

[Series 4: DWI · axial · 3.0mm · 1.80mm/px · z∈[-58,+89]mm · 7 of 50 slices shown (2 of 2)]
[im 1/50]
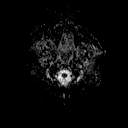
[im 9/50]
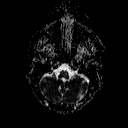
[im 17/50]
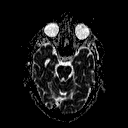
[im 25/50]
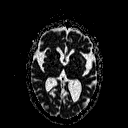
[im 33/50]
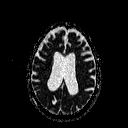
[im 41/50]
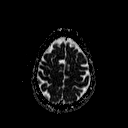
[im 50/50]
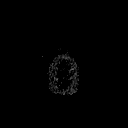

[Series 5: T2 · axial · 5.0mm · 0.45mm/px · z∈[-56,+87]mm · 3 of 23 slices shown]
[im 1/23]
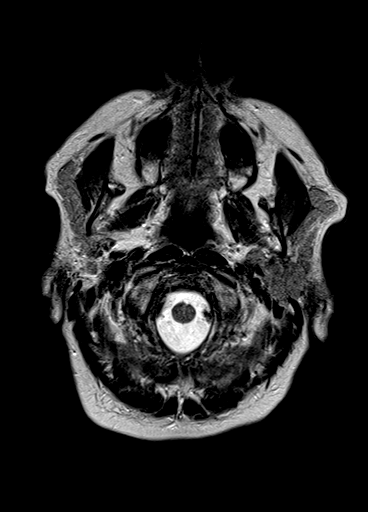
[im 12/23]
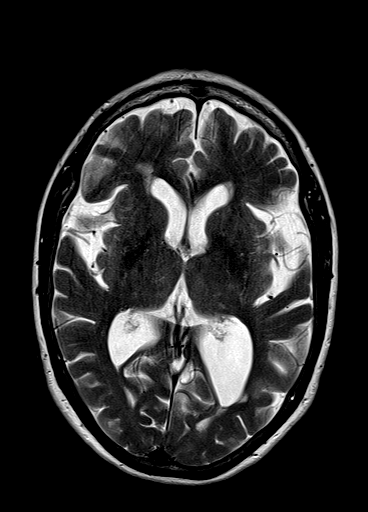
[im 23/23]
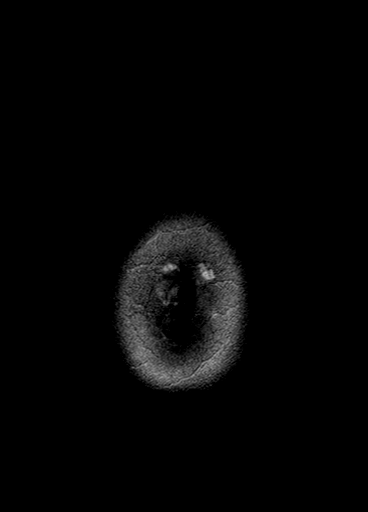

[Series 6: FLAIR · axial · 5.0mm · 0.45mm/px · z∈[-56,+87]mm · 3 of 23 slices shown]
[im 1/23]
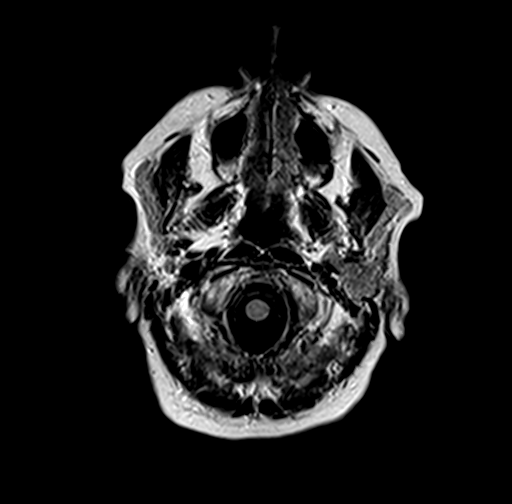
[im 12/23]
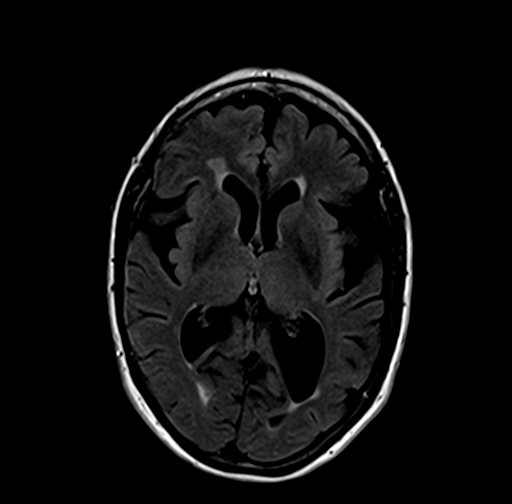
[im 23/23]
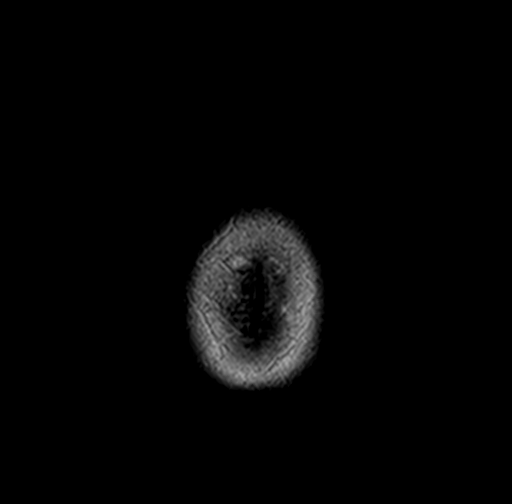

[Series 7: T1 · coronal · 3.0mm · 0.35mm/px · 1 of 11 slices shown (2 of 3)]
[im 1/11]
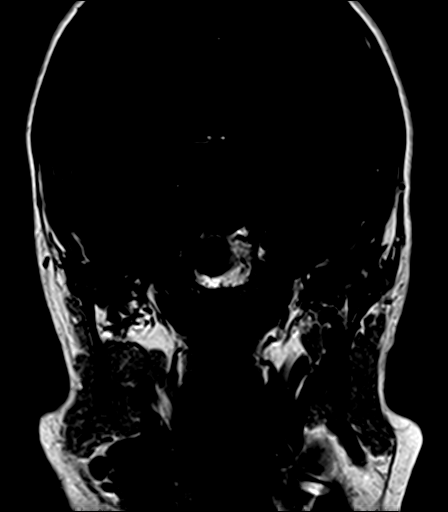

[Series 8: T1 · axial · 3.0mm · 0.35mm/px · 1 of 11 slices shown (3 of 3)]
[im 1/11]
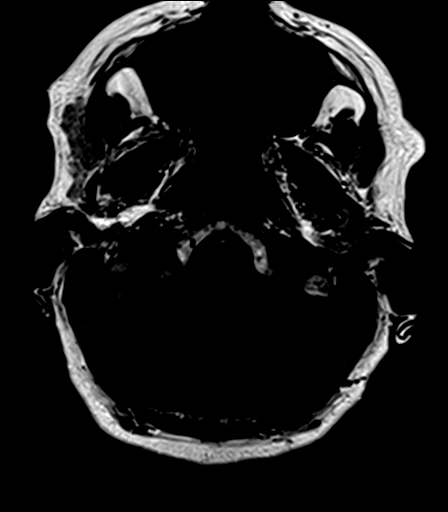

[Series 9: bSSFP · axial · 1.0mm · 0.28mm/px · z∈[-44,-13]mm · 4 of 32 slices shown]
[im 1/32]
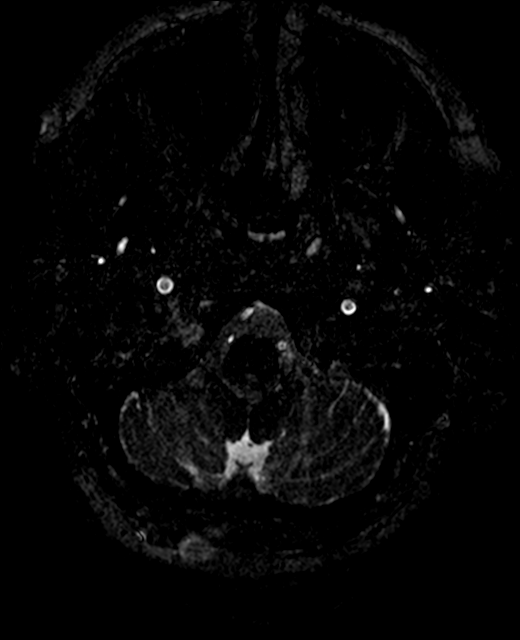
[im 11/32]
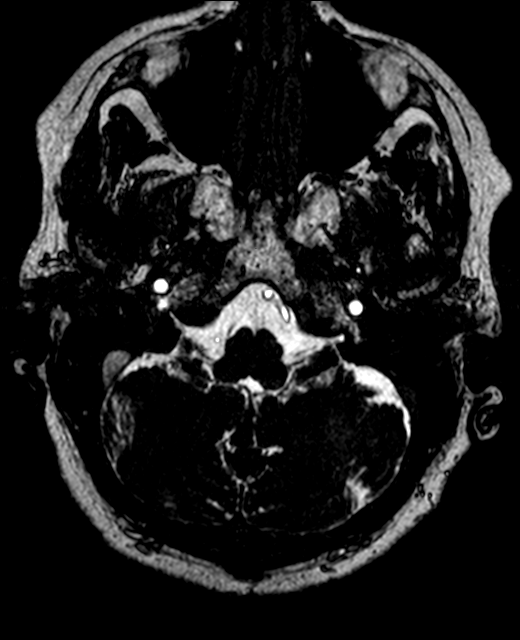
[im 21/32]
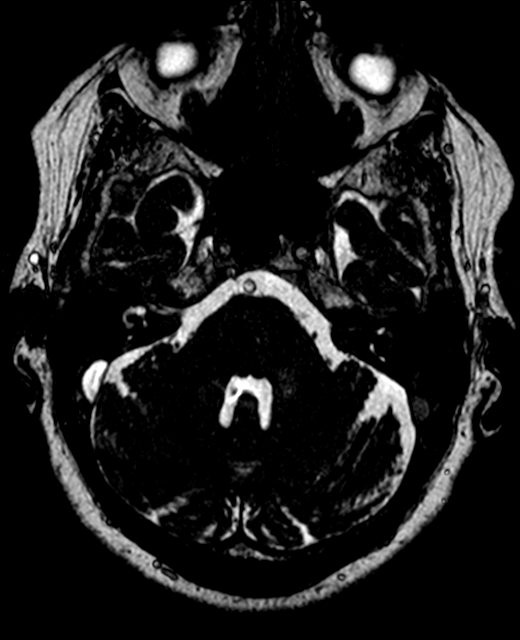
[im 32/32]
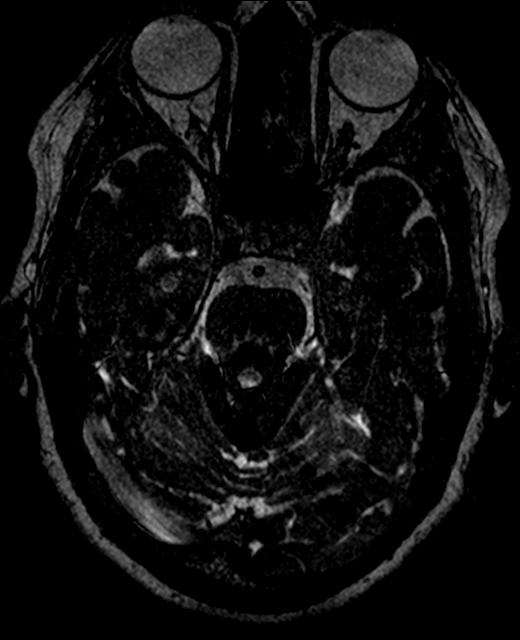

[Series 10: T1 post-contrast · coronal · 3.0mm · 0.35mm/px · 1 of 11 slices shown (1 of 2)]
[im 1/11]
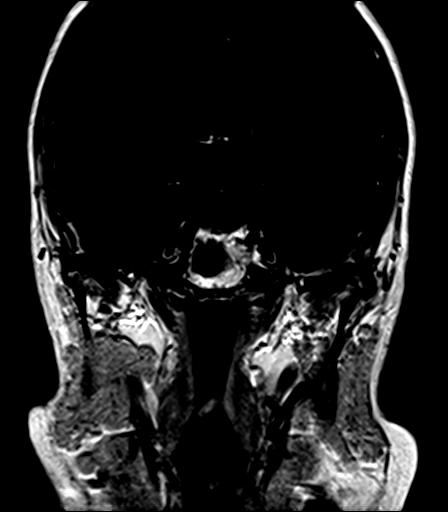

[Series 11: T1 post-contrast · axial · 3.0mm · 0.35mm/px · 1 of 11 slices shown (2 of 2)]
[im 1/11]
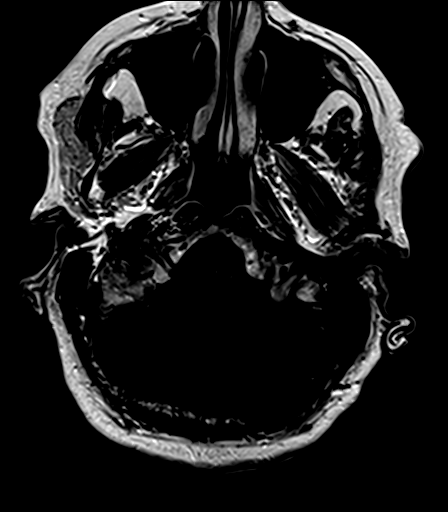

[33 of 48 positions shown; findings below may reference images not displayed]

FINDINGS: No acute infarct.

Labyrinthine structures are normal and symmetric bilaterally. No
internal auditory canal enhancing lesion or evidence of vestibular
aqueduct dilation.

Mild to slightly moderate small vessel disease type changes.

Global atrophy. Ventricular prominence probably related to atrophy
rather than hydrocephalus.

No intracranial mass or abnormal enhancement.

No intracranial hemorrhage.

Major intracranial vascular structures are patent.

Cervical medullary junction, pituitary region, pineal region and
orbital structures unremarkable.
IMPRESSION: Labyrinthine structures are normal and symmetric bilaterally. No
internal auditory canal enhancing lesion or evidence of vestibular
aqueduct dilation.

Mild to slightly moderate small vessel disease type changes.

No acute infarct.

Global atrophy. Ventricular prominence probably related to atrophy
rather than hydrocephalus.

## 2016-08-30 DIAGNOSIS — Z0000 Encounter for general adult medical examination without abnormal findings: Secondary | ICD-10-CM | POA: Diagnosis not present

## 2016-08-30 DIAGNOSIS — I10 Essential (primary) hypertension: Secondary | ICD-10-CM | POA: Diagnosis not present

## 2016-08-30 DIAGNOSIS — R269 Unspecified abnormalities of gait and mobility: Secondary | ICD-10-CM | POA: Diagnosis not present

## 2016-08-30 DIAGNOSIS — Z1389 Encounter for screening for other disorder: Secondary | ICD-10-CM | POA: Diagnosis not present

## 2016-08-30 DIAGNOSIS — E0590 Thyrotoxicosis, unspecified without thyrotoxic crisis or storm: Secondary | ICD-10-CM | POA: Diagnosis not present

## 2016-09-05 DIAGNOSIS — Z85820 Personal history of malignant melanoma of skin: Secondary | ICD-10-CM | POA: Diagnosis not present

## 2016-09-05 DIAGNOSIS — D1801 Hemangioma of skin and subcutaneous tissue: Secondary | ICD-10-CM | POA: Diagnosis not present

## 2016-09-05 DIAGNOSIS — D485 Neoplasm of uncertain behavior of skin: Secondary | ICD-10-CM | POA: Diagnosis not present

## 2016-09-05 DIAGNOSIS — L570 Actinic keratosis: Secondary | ICD-10-CM | POA: Diagnosis not present

## 2016-09-05 DIAGNOSIS — L821 Other seborrheic keratosis: Secondary | ICD-10-CM | POA: Diagnosis not present

## 2016-09-13 DIAGNOSIS — M25561 Pain in right knee: Secondary | ICD-10-CM | POA: Diagnosis not present

## 2016-09-13 DIAGNOSIS — R262 Difficulty in walking, not elsewhere classified: Secondary | ICD-10-CM | POA: Diagnosis not present

## 2016-09-13 DIAGNOSIS — M25562 Pain in left knee: Secondary | ICD-10-CM | POA: Diagnosis not present

## 2016-09-18 DIAGNOSIS — M25562 Pain in left knee: Secondary | ICD-10-CM | POA: Diagnosis not present

## 2016-09-18 DIAGNOSIS — M25561 Pain in right knee: Secondary | ICD-10-CM | POA: Diagnosis not present

## 2016-09-18 DIAGNOSIS — R262 Difficulty in walking, not elsewhere classified: Secondary | ICD-10-CM | POA: Diagnosis not present

## 2016-10-01 DIAGNOSIS — M25561 Pain in right knee: Secondary | ICD-10-CM | POA: Diagnosis not present

## 2016-10-01 DIAGNOSIS — R262 Difficulty in walking, not elsewhere classified: Secondary | ICD-10-CM | POA: Diagnosis not present

## 2016-10-01 DIAGNOSIS — M25562 Pain in left knee: Secondary | ICD-10-CM | POA: Diagnosis not present

## 2016-10-04 DIAGNOSIS — M25562 Pain in left knee: Secondary | ICD-10-CM | POA: Diagnosis not present

## 2016-10-04 DIAGNOSIS — R262 Difficulty in walking, not elsewhere classified: Secondary | ICD-10-CM | POA: Diagnosis not present

## 2016-10-04 DIAGNOSIS — M25561 Pain in right knee: Secondary | ICD-10-CM | POA: Diagnosis not present

## 2016-10-10 DIAGNOSIS — N390 Urinary tract infection, site not specified: Secondary | ICD-10-CM | POA: Diagnosis not present

## 2016-10-15 DIAGNOSIS — M25562 Pain in left knee: Secondary | ICD-10-CM | POA: Diagnosis not present

## 2016-10-15 DIAGNOSIS — R262 Difficulty in walking, not elsewhere classified: Secondary | ICD-10-CM | POA: Diagnosis not present

## 2016-10-15 DIAGNOSIS — M25561 Pain in right knee: Secondary | ICD-10-CM | POA: Diagnosis not present

## 2016-10-22 DIAGNOSIS — R262 Difficulty in walking, not elsewhere classified: Secondary | ICD-10-CM | POA: Diagnosis not present

## 2016-10-22 DIAGNOSIS — M25561 Pain in right knee: Secondary | ICD-10-CM | POA: Diagnosis not present

## 2016-10-22 DIAGNOSIS — M25562 Pain in left knee: Secondary | ICD-10-CM | POA: Diagnosis not present

## 2016-10-25 DIAGNOSIS — M25561 Pain in right knee: Secondary | ICD-10-CM | POA: Diagnosis not present

## 2016-10-25 DIAGNOSIS — R262 Difficulty in walking, not elsewhere classified: Secondary | ICD-10-CM | POA: Diagnosis not present

## 2016-10-25 DIAGNOSIS — M25562 Pain in left knee: Secondary | ICD-10-CM | POA: Diagnosis not present

## 2016-10-29 DIAGNOSIS — H401132 Primary open-angle glaucoma, bilateral, moderate stage: Secondary | ICD-10-CM | POA: Diagnosis not present

## 2016-11-01 DIAGNOSIS — M25562 Pain in left knee: Secondary | ICD-10-CM | POA: Diagnosis not present

## 2016-11-01 DIAGNOSIS — M25561 Pain in right knee: Secondary | ICD-10-CM | POA: Diagnosis not present

## 2016-11-01 DIAGNOSIS — R262 Difficulty in walking, not elsewhere classified: Secondary | ICD-10-CM | POA: Diagnosis not present

## 2016-11-05 DIAGNOSIS — M25562 Pain in left knee: Secondary | ICD-10-CM | POA: Diagnosis not present

## 2016-11-05 DIAGNOSIS — R262 Difficulty in walking, not elsewhere classified: Secondary | ICD-10-CM | POA: Diagnosis not present

## 2016-11-05 DIAGNOSIS — M25561 Pain in right knee: Secondary | ICD-10-CM | POA: Diagnosis not present

## 2016-11-08 DIAGNOSIS — R262 Difficulty in walking, not elsewhere classified: Secondary | ICD-10-CM | POA: Diagnosis not present

## 2016-11-08 DIAGNOSIS — M25561 Pain in right knee: Secondary | ICD-10-CM | POA: Diagnosis not present

## 2016-11-08 DIAGNOSIS — M25562 Pain in left knee: Secondary | ICD-10-CM | POA: Diagnosis not present

## 2016-11-11 DIAGNOSIS — M25561 Pain in right knee: Secondary | ICD-10-CM | POA: Diagnosis not present

## 2016-11-11 DIAGNOSIS — M25562 Pain in left knee: Secondary | ICD-10-CM | POA: Diagnosis not present

## 2016-11-11 DIAGNOSIS — R262 Difficulty in walking, not elsewhere classified: Secondary | ICD-10-CM | POA: Diagnosis not present

## 2016-11-13 DIAGNOSIS — M25562 Pain in left knee: Secondary | ICD-10-CM | POA: Diagnosis not present

## 2016-11-13 DIAGNOSIS — M25561 Pain in right knee: Secondary | ICD-10-CM | POA: Diagnosis not present

## 2016-11-13 DIAGNOSIS — R262 Difficulty in walking, not elsewhere classified: Secondary | ICD-10-CM | POA: Diagnosis not present

## 2016-11-16 DIAGNOSIS — M25512 Pain in left shoulder: Secondary | ICD-10-CM | POA: Diagnosis not present

## 2016-11-18 DIAGNOSIS — M25561 Pain in right knee: Secondary | ICD-10-CM | POA: Diagnosis not present

## 2016-11-18 DIAGNOSIS — M25562 Pain in left knee: Secondary | ICD-10-CM | POA: Diagnosis not present

## 2016-11-18 DIAGNOSIS — R262 Difficulty in walking, not elsewhere classified: Secondary | ICD-10-CM | POA: Diagnosis not present

## 2016-11-26 DIAGNOSIS — M25561 Pain in right knee: Secondary | ICD-10-CM | POA: Diagnosis not present

## 2016-11-26 DIAGNOSIS — M1812 Unilateral primary osteoarthritis of first carpometacarpal joint, left hand: Secondary | ICD-10-CM | POA: Diagnosis not present

## 2016-11-26 DIAGNOSIS — R262 Difficulty in walking, not elsewhere classified: Secondary | ICD-10-CM | POA: Diagnosis not present

## 2016-11-26 DIAGNOSIS — M25562 Pain in left knee: Secondary | ICD-10-CM | POA: Diagnosis not present

## 2016-11-28 ENCOUNTER — Other Ambulatory Visit: Payer: Self-pay | Admitting: Internal Medicine

## 2016-11-28 DIAGNOSIS — Z1231 Encounter for screening mammogram for malignant neoplasm of breast: Secondary | ICD-10-CM

## 2016-12-04 DIAGNOSIS — M25562 Pain in left knee: Secondary | ICD-10-CM | POA: Diagnosis not present

## 2016-12-04 DIAGNOSIS — M25561 Pain in right knee: Secondary | ICD-10-CM | POA: Diagnosis not present

## 2016-12-04 DIAGNOSIS — R262 Difficulty in walking, not elsewhere classified: Secondary | ICD-10-CM | POA: Diagnosis not present

## 2016-12-06 DIAGNOSIS — R262 Difficulty in walking, not elsewhere classified: Secondary | ICD-10-CM | POA: Diagnosis not present

## 2016-12-06 DIAGNOSIS — M25561 Pain in right knee: Secondary | ICD-10-CM | POA: Diagnosis not present

## 2016-12-06 DIAGNOSIS — M25562 Pain in left knee: Secondary | ICD-10-CM | POA: Diagnosis not present

## 2016-12-12 DIAGNOSIS — R262 Difficulty in walking, not elsewhere classified: Secondary | ICD-10-CM | POA: Diagnosis not present

## 2016-12-12 DIAGNOSIS — M25562 Pain in left knee: Secondary | ICD-10-CM | POA: Diagnosis not present

## 2016-12-12 DIAGNOSIS — M25561 Pain in right knee: Secondary | ICD-10-CM | POA: Diagnosis not present

## 2016-12-16 DIAGNOSIS — M25561 Pain in right knee: Secondary | ICD-10-CM | POA: Diagnosis not present

## 2016-12-16 DIAGNOSIS — M25562 Pain in left knee: Secondary | ICD-10-CM | POA: Diagnosis not present

## 2016-12-16 DIAGNOSIS — R262 Difficulty in walking, not elsewhere classified: Secondary | ICD-10-CM | POA: Diagnosis not present

## 2017-01-02 ENCOUNTER — Ambulatory Visit
Admission: RE | Admit: 2017-01-02 | Discharge: 2017-01-02 | Disposition: A | Discharge status: Home / Self Care | Payer: 59 | Source: Ambulatory Visit | Attending: Internal Medicine | Admitting: Internal Medicine

## 2017-01-02 DIAGNOSIS — Z1231 Encounter for screening mammogram for malignant neoplasm of breast: Secondary | ICD-10-CM | POA: Diagnosis not present

## 2017-01-07 DIAGNOSIS — M1812 Unilateral primary osteoarthritis of first carpometacarpal joint, left hand: Secondary | ICD-10-CM | POA: Diagnosis not present

## 2017-03-03 DIAGNOSIS — H401132 Primary open-angle glaucoma, bilateral, moderate stage: Secondary | ICD-10-CM | POA: Diagnosis not present

## 2017-03-04 DIAGNOSIS — I10 Essential (primary) hypertension: Secondary | ICD-10-CM | POA: Diagnosis not present

## 2017-03-04 DIAGNOSIS — S76112A Strain of left quadriceps muscle, fascia and tendon, initial encounter: Secondary | ICD-10-CM | POA: Diagnosis not present

## 2017-03-07 DIAGNOSIS — M79605 Pain in left leg: Secondary | ICD-10-CM | POA: Diagnosis not present

## 2017-03-10 DIAGNOSIS — L821 Other seborrheic keratosis: Secondary | ICD-10-CM | POA: Diagnosis not present

## 2017-03-10 DIAGNOSIS — L218 Other seborrheic dermatitis: Secondary | ICD-10-CM | POA: Diagnosis not present

## 2017-03-10 DIAGNOSIS — L812 Freckles: Secondary | ICD-10-CM | POA: Diagnosis not present

## 2017-03-10 DIAGNOSIS — D485 Neoplasm of uncertain behavior of skin: Secondary | ICD-10-CM | POA: Diagnosis not present

## 2017-03-10 DIAGNOSIS — D1801 Hemangioma of skin and subcutaneous tissue: Secondary | ICD-10-CM | POA: Diagnosis not present

## 2017-03-10 DIAGNOSIS — L820 Inflamed seborrheic keratosis: Secondary | ICD-10-CM | POA: Diagnosis not present

## 2017-03-12 DIAGNOSIS — M79605 Pain in left leg: Secondary | ICD-10-CM | POA: Diagnosis not present

## 2017-03-17 DIAGNOSIS — M79605 Pain in left leg: Secondary | ICD-10-CM | POA: Diagnosis not present

## 2017-03-25 DIAGNOSIS — M79605 Pain in left leg: Secondary | ICD-10-CM | POA: Diagnosis not present

## 2017-04-04 DIAGNOSIS — M79605 Pain in left leg: Secondary | ICD-10-CM | POA: Diagnosis not present

## 2017-04-09 DIAGNOSIS — M79605 Pain in left leg: Secondary | ICD-10-CM | POA: Diagnosis not present

## 2017-04-18 DIAGNOSIS — M79605 Pain in left leg: Secondary | ICD-10-CM | POA: Diagnosis not present

## 2017-04-22 DIAGNOSIS — M79605 Pain in left leg: Secondary | ICD-10-CM | POA: Diagnosis not present

## 2017-04-29 DIAGNOSIS — M79605 Pain in left leg: Secondary | ICD-10-CM | POA: Diagnosis not present

## 2017-05-05 DIAGNOSIS — H401132 Primary open-angle glaucoma, bilateral, moderate stage: Secondary | ICD-10-CM | POA: Diagnosis not present

## 2017-05-07 DIAGNOSIS — M79605 Pain in left leg: Secondary | ICD-10-CM | POA: Diagnosis not present

## 2017-05-12 DIAGNOSIS — M79605 Pain in left leg: Secondary | ICD-10-CM | POA: Diagnosis not present

## 2017-06-24 DIAGNOSIS — Z23 Encounter for immunization: Secondary | ICD-10-CM | POA: Diagnosis not present

## 2017-09-01 DIAGNOSIS — H401132 Primary open-angle glaucoma, bilateral, moderate stage: Secondary | ICD-10-CM | POA: Diagnosis not present

## 2017-09-02 DIAGNOSIS — I10 Essential (primary) hypertension: Secondary | ICD-10-CM | POA: Diagnosis not present

## 2017-09-02 DIAGNOSIS — E0590 Thyrotoxicosis, unspecified without thyrotoxic crisis or storm: Secondary | ICD-10-CM | POA: Diagnosis not present

## 2017-09-02 DIAGNOSIS — Z6830 Body mass index (BMI) 30.0-30.9, adult: Secondary | ICD-10-CM | POA: Diagnosis not present

## 2017-09-02 DIAGNOSIS — E669 Obesity, unspecified: Secondary | ICD-10-CM | POA: Diagnosis not present

## 2017-09-03 DIAGNOSIS — Z1389 Encounter for screening for other disorder: Secondary | ICD-10-CM | POA: Diagnosis not present

## 2017-09-03 DIAGNOSIS — Z0000 Encounter for general adult medical examination without abnormal findings: Secondary | ICD-10-CM | POA: Diagnosis not present

## 2017-09-29 DIAGNOSIS — Z85820 Personal history of malignant melanoma of skin: Secondary | ICD-10-CM | POA: Diagnosis not present

## 2017-09-29 DIAGNOSIS — L820 Inflamed seborrheic keratosis: Secondary | ICD-10-CM | POA: Diagnosis not present

## 2017-09-29 DIAGNOSIS — L821 Other seborrheic keratosis: Secondary | ICD-10-CM | POA: Diagnosis not present

## 2017-09-29 DIAGNOSIS — L218 Other seborrheic dermatitis: Secondary | ICD-10-CM | POA: Diagnosis not present

## 2017-09-29 DIAGNOSIS — D2261 Melanocytic nevi of right upper limb, including shoulder: Secondary | ICD-10-CM | POA: Diagnosis not present

## 2017-11-25 ENCOUNTER — Other Ambulatory Visit: Payer: Self-pay | Admitting: Internal Medicine

## 2017-11-25 DIAGNOSIS — Z1231 Encounter for screening mammogram for malignant neoplasm of breast: Secondary | ICD-10-CM

## 2017-12-02 DIAGNOSIS — H401132 Primary open-angle glaucoma, bilateral, moderate stage: Secondary | ICD-10-CM | POA: Diagnosis not present

## 2018-01-05 ENCOUNTER — Ambulatory Visit
Admission: RE | Admit: 2018-01-05 | Discharge: 2018-01-05 | Disposition: A | Discharge status: Home / Self Care | Payer: Medicare Other | Source: Ambulatory Visit | Attending: Internal Medicine | Admitting: Internal Medicine

## 2018-01-05 DIAGNOSIS — Z1231 Encounter for screening mammogram for malignant neoplasm of breast: Secondary | ICD-10-CM | POA: Diagnosis not present

## 2018-03-06 DIAGNOSIS — M179 Osteoarthritis of knee, unspecified: Secondary | ICD-10-CM | POA: Diagnosis not present

## 2018-03-06 DIAGNOSIS — Z85820 Personal history of malignant melanoma of skin: Secondary | ICD-10-CM | POA: Diagnosis not present

## 2018-03-06 DIAGNOSIS — I10 Essential (primary) hypertension: Secondary | ICD-10-CM | POA: Diagnosis not present

## 2018-03-06 DIAGNOSIS — R269 Unspecified abnormalities of gait and mobility: Secondary | ICD-10-CM | POA: Diagnosis not present

## 2018-03-06 DIAGNOSIS — Z86010 Personal history of colonic polyps: Secondary | ICD-10-CM | POA: Diagnosis not present

## 2018-03-13 DIAGNOSIS — M25562 Pain in left knee: Secondary | ICD-10-CM | POA: Diagnosis not present

## 2018-03-13 DIAGNOSIS — R262 Difficulty in walking, not elsewhere classified: Secondary | ICD-10-CM | POA: Diagnosis not present

## 2018-03-17 DIAGNOSIS — R262 Difficulty in walking, not elsewhere classified: Secondary | ICD-10-CM | POA: Diagnosis not present

## 2018-03-17 DIAGNOSIS — M25562 Pain in left knee: Secondary | ICD-10-CM | POA: Diagnosis not present

## 2018-03-20 DIAGNOSIS — M25562 Pain in left knee: Secondary | ICD-10-CM | POA: Diagnosis not present

## 2018-03-20 DIAGNOSIS — R262 Difficulty in walking, not elsewhere classified: Secondary | ICD-10-CM | POA: Diagnosis not present

## 2018-03-23 DIAGNOSIS — I8312 Varicose veins of left lower extremity with inflammation: Secondary | ICD-10-CM | POA: Diagnosis not present

## 2018-03-23 DIAGNOSIS — L812 Freckles: Secondary | ICD-10-CM | POA: Diagnosis not present

## 2018-03-23 DIAGNOSIS — D1801 Hemangioma of skin and subcutaneous tissue: Secondary | ICD-10-CM | POA: Diagnosis not present

## 2018-03-23 DIAGNOSIS — I8311 Varicose veins of right lower extremity with inflammation: Secondary | ICD-10-CM | POA: Diagnosis not present

## 2018-03-23 DIAGNOSIS — I872 Venous insufficiency (chronic) (peripheral): Secondary | ICD-10-CM | POA: Diagnosis not present

## 2018-03-23 DIAGNOSIS — L821 Other seborrheic keratosis: Secondary | ICD-10-CM | POA: Diagnosis not present

## 2018-03-23 DIAGNOSIS — L218 Other seborrheic dermatitis: Secondary | ICD-10-CM | POA: Diagnosis not present

## 2018-03-23 DIAGNOSIS — Z85820 Personal history of malignant melanoma of skin: Secondary | ICD-10-CM | POA: Diagnosis not present

## 2018-03-24 DIAGNOSIS — M25562 Pain in left knee: Secondary | ICD-10-CM | POA: Diagnosis not present

## 2018-03-24 DIAGNOSIS — R262 Difficulty in walking, not elsewhere classified: Secondary | ICD-10-CM | POA: Diagnosis not present

## 2018-03-26 DIAGNOSIS — M25562 Pain in left knee: Secondary | ICD-10-CM | POA: Diagnosis not present

## 2018-03-26 DIAGNOSIS — R262 Difficulty in walking, not elsewhere classified: Secondary | ICD-10-CM | POA: Diagnosis not present

## 2018-03-30 DIAGNOSIS — R262 Difficulty in walking, not elsewhere classified: Secondary | ICD-10-CM | POA: Diagnosis not present

## 2018-03-30 DIAGNOSIS — M25562 Pain in left knee: Secondary | ICD-10-CM | POA: Diagnosis not present

## 2018-04-03 DIAGNOSIS — M25562 Pain in left knee: Secondary | ICD-10-CM | POA: Diagnosis not present

## 2018-04-03 DIAGNOSIS — R262 Difficulty in walking, not elsewhere classified: Secondary | ICD-10-CM | POA: Diagnosis not present

## 2018-04-06 DIAGNOSIS — H401132 Primary open-angle glaucoma, bilateral, moderate stage: Secondary | ICD-10-CM | POA: Diagnosis not present

## 2018-04-07 DIAGNOSIS — R262 Difficulty in walking, not elsewhere classified: Secondary | ICD-10-CM | POA: Diagnosis not present

## 2018-04-07 DIAGNOSIS — M25562 Pain in left knee: Secondary | ICD-10-CM | POA: Diagnosis not present

## 2018-04-09 DIAGNOSIS — M25562 Pain in left knee: Secondary | ICD-10-CM | POA: Diagnosis not present

## 2018-04-09 DIAGNOSIS — R262 Difficulty in walking, not elsewhere classified: Secondary | ICD-10-CM | POA: Diagnosis not present

## 2018-04-14 DIAGNOSIS — M25562 Pain in left knee: Secondary | ICD-10-CM | POA: Diagnosis not present

## 2018-04-14 DIAGNOSIS — R262 Difficulty in walking, not elsewhere classified: Secondary | ICD-10-CM | POA: Diagnosis not present

## 2018-04-21 DIAGNOSIS — M25562 Pain in left knee: Secondary | ICD-10-CM | POA: Diagnosis not present

## 2018-04-21 DIAGNOSIS — R262 Difficulty in walking, not elsewhere classified: Secondary | ICD-10-CM | POA: Diagnosis not present

## 2018-04-23 DIAGNOSIS — R262 Difficulty in walking, not elsewhere classified: Secondary | ICD-10-CM | POA: Diagnosis not present

## 2018-04-23 DIAGNOSIS — M25562 Pain in left knee: Secondary | ICD-10-CM | POA: Diagnosis not present

## 2018-04-28 DIAGNOSIS — M25562 Pain in left knee: Secondary | ICD-10-CM | POA: Diagnosis not present

## 2018-04-28 DIAGNOSIS — R262 Difficulty in walking, not elsewhere classified: Secondary | ICD-10-CM | POA: Diagnosis not present

## 2018-04-29 DIAGNOSIS — M1712 Unilateral primary osteoarthritis, left knee: Secondary | ICD-10-CM | POA: Diagnosis not present

## 2018-04-29 DIAGNOSIS — M1711 Unilateral primary osteoarthritis, right knee: Secondary | ICD-10-CM | POA: Diagnosis not present

## 2018-05-27 DIAGNOSIS — M1711 Unilateral primary osteoarthritis, right knee: Secondary | ICD-10-CM | POA: Diagnosis not present

## 2018-05-27 DIAGNOSIS — M1712 Unilateral primary osteoarthritis, left knee: Secondary | ICD-10-CM | POA: Diagnosis not present

## 2018-06-11 DIAGNOSIS — Z23 Encounter for immunization: Secondary | ICD-10-CM | POA: Diagnosis not present

## 2018-07-01 DIAGNOSIS — R8279 Other abnormal findings on microbiological examination of urine: Secondary | ICD-10-CM | POA: Diagnosis not present

## 2018-07-01 DIAGNOSIS — R351 Nocturia: Secondary | ICD-10-CM | POA: Diagnosis not present

## 2018-07-09 DIAGNOSIS — L299 Pruritus, unspecified: Secondary | ICD-10-CM | POA: Diagnosis not present

## 2018-07-09 DIAGNOSIS — H9202 Otalgia, left ear: Secondary | ICD-10-CM | POA: Diagnosis not present

## 2018-07-09 DIAGNOSIS — M2669 Other specified disorders of temporomandibular joint: Secondary | ICD-10-CM | POA: Diagnosis not present

## 2018-07-14 DIAGNOSIS — H401132 Primary open-angle glaucoma, bilateral, moderate stage: Secondary | ICD-10-CM | POA: Diagnosis not present

## 2018-07-21 DIAGNOSIS — R350 Frequency of micturition: Secondary | ICD-10-CM | POA: Diagnosis not present

## 2018-07-21 DIAGNOSIS — R351 Nocturia: Secondary | ICD-10-CM | POA: Diagnosis not present

## 2018-08-07 DIAGNOSIS — M1712 Unilateral primary osteoarthritis, left knee: Secondary | ICD-10-CM | POA: Diagnosis not present

## 2018-08-07 DIAGNOSIS — M1711 Unilateral primary osteoarthritis, right knee: Secondary | ICD-10-CM | POA: Diagnosis not present

## 2018-08-11 DIAGNOSIS — R8279 Other abnormal findings on microbiological examination of urine: Secondary | ICD-10-CM | POA: Diagnosis not present

## 2018-08-11 DIAGNOSIS — R351 Nocturia: Secondary | ICD-10-CM | POA: Diagnosis not present

## 2018-08-14 DIAGNOSIS — M1712 Unilateral primary osteoarthritis, left knee: Secondary | ICD-10-CM | POA: Diagnosis not present

## 2018-08-14 DIAGNOSIS — M1711 Unilateral primary osteoarthritis, right knee: Secondary | ICD-10-CM | POA: Diagnosis not present

## 2018-08-21 DIAGNOSIS — M1711 Unilateral primary osteoarthritis, right knee: Secondary | ICD-10-CM | POA: Diagnosis not present

## 2018-08-21 DIAGNOSIS — M1712 Unilateral primary osteoarthritis, left knee: Secondary | ICD-10-CM | POA: Diagnosis not present

## 2018-09-02 DIAGNOSIS — R05 Cough: Secondary | ICD-10-CM | POA: Diagnosis not present

## 2018-09-02 DIAGNOSIS — J028 Acute pharyngitis due to other specified organisms: Secondary | ICD-10-CM | POA: Diagnosis not present

## 2018-09-07 DIAGNOSIS — L820 Inflamed seborrheic keratosis: Secondary | ICD-10-CM | POA: Diagnosis not present

## 2018-09-07 DIAGNOSIS — L720 Epidermal cyst: Secondary | ICD-10-CM | POA: Diagnosis not present

## 2018-09-07 DIAGNOSIS — L218 Other seborrheic dermatitis: Secondary | ICD-10-CM | POA: Diagnosis not present

## 2018-09-07 DIAGNOSIS — L821 Other seborrheic keratosis: Secondary | ICD-10-CM | POA: Diagnosis not present

## 2018-10-19 DIAGNOSIS — Z1389 Encounter for screening for other disorder: Secondary | ICD-10-CM | POA: Diagnosis not present

## 2018-10-19 DIAGNOSIS — Z86010 Personal history of colonic polyps: Secondary | ICD-10-CM | POA: Diagnosis not present

## 2018-10-19 DIAGNOSIS — I10 Essential (primary) hypertension: Secondary | ICD-10-CM | POA: Diagnosis not present

## 2018-10-19 DIAGNOSIS — Z85820 Personal history of malignant melanoma of skin: Secondary | ICD-10-CM | POA: Diagnosis not present

## 2018-10-19 DIAGNOSIS — Z8639 Personal history of other endocrine, nutritional and metabolic disease: Secondary | ICD-10-CM | POA: Diagnosis not present

## 2018-10-19 DIAGNOSIS — Z0000 Encounter for general adult medical examination without abnormal findings: Secondary | ICD-10-CM | POA: Diagnosis not present

## 2018-11-11 DIAGNOSIS — H401132 Primary open-angle glaucoma, bilateral, moderate stage: Secondary | ICD-10-CM | POA: Diagnosis not present

## 2018-11-26 ENCOUNTER — Other Ambulatory Visit: Payer: Self-pay | Admitting: Internal Medicine

## 2018-11-26 DIAGNOSIS — Z1231 Encounter for screening mammogram for malignant neoplasm of breast: Secondary | ICD-10-CM

## 2018-12-01 DIAGNOSIS — D120 Benign neoplasm of cecum: Secondary | ICD-10-CM | POA: Diagnosis not present

## 2018-12-01 DIAGNOSIS — Z86010 Personal history of colonic polyps: Secondary | ICD-10-CM | POA: Diagnosis not present

## 2018-12-04 DIAGNOSIS — D120 Benign neoplasm of cecum: Secondary | ICD-10-CM | POA: Diagnosis not present

## 2018-12-23 DIAGNOSIS — M1812 Unilateral primary osteoarthritis of first carpometacarpal joint, left hand: Secondary | ICD-10-CM | POA: Diagnosis not present

## 2018-12-23 DIAGNOSIS — M1712 Unilateral primary osteoarthritis, left knee: Secondary | ICD-10-CM | POA: Diagnosis not present

## 2019-01-07 ENCOUNTER — Ambulatory Visit: Payer: 59

## 2019-03-01 DIAGNOSIS — M1711 Unilateral primary osteoarthritis, right knee: Secondary | ICD-10-CM | POA: Diagnosis not present

## 2019-03-01 DIAGNOSIS — M25512 Pain in left shoulder: Secondary | ICD-10-CM | POA: Diagnosis not present

## 2019-03-01 DIAGNOSIS — M25511 Pain in right shoulder: Secondary | ICD-10-CM | POA: Diagnosis not present

## 2019-03-01 DIAGNOSIS — M1712 Unilateral primary osteoarthritis, left knee: Secondary | ICD-10-CM | POA: Diagnosis not present

## 2019-03-11 DIAGNOSIS — R300 Dysuria: Secondary | ICD-10-CM | POA: Diagnosis not present

## 2019-03-15 DIAGNOSIS — M1711 Unilateral primary osteoarthritis, right knee: Secondary | ICD-10-CM | POA: Diagnosis not present

## 2019-03-15 DIAGNOSIS — M1712 Unilateral primary osteoarthritis, left knee: Secondary | ICD-10-CM | POA: Diagnosis not present

## 2019-03-17 DIAGNOSIS — L218 Other seborrheic dermatitis: Secondary | ICD-10-CM | POA: Diagnosis not present

## 2019-03-17 DIAGNOSIS — L309 Dermatitis, unspecified: Secondary | ICD-10-CM | POA: Diagnosis not present

## 2019-03-17 DIAGNOSIS — L821 Other seborrheic keratosis: Secondary | ICD-10-CM | POA: Diagnosis not present

## 2019-03-17 DIAGNOSIS — D1801 Hemangioma of skin and subcutaneous tissue: Secondary | ICD-10-CM | POA: Diagnosis not present

## 2019-03-18 ENCOUNTER — Ambulatory Visit
Admission: RE | Admit: 2019-03-18 | Discharge: 2019-03-18 | Disposition: A | Discharge status: Home / Self Care | Payer: Medicare Other | Source: Ambulatory Visit | Attending: Internal Medicine | Admitting: Internal Medicine

## 2019-03-18 ENCOUNTER — Other Ambulatory Visit: Payer: Self-pay

## 2019-03-18 DIAGNOSIS — Z1231 Encounter for screening mammogram for malignant neoplasm of breast: Secondary | ICD-10-CM

## 2019-03-25 DIAGNOSIS — N390 Urinary tract infection, site not specified: Secondary | ICD-10-CM | POA: Diagnosis not present

## 2019-03-29 DIAGNOSIS — M1712 Unilateral primary osteoarthritis, left knee: Secondary | ICD-10-CM | POA: Diagnosis not present

## 2019-03-29 DIAGNOSIS — M1711 Unilateral primary osteoarthritis, right knee: Secondary | ICD-10-CM | POA: Diagnosis not present

## 2019-04-05 DIAGNOSIS — M1712 Unilateral primary osteoarthritis, left knee: Secondary | ICD-10-CM | POA: Diagnosis not present

## 2019-04-05 DIAGNOSIS — M1711 Unilateral primary osteoarthritis, right knee: Secondary | ICD-10-CM | POA: Diagnosis not present

## 2019-04-20 DIAGNOSIS — I10 Essential (primary) hypertension: Secondary | ICD-10-CM | POA: Diagnosis not present

## 2019-05-05 DIAGNOSIS — M25512 Pain in left shoulder: Secondary | ICD-10-CM | POA: Diagnosis not present

## 2019-05-10 DIAGNOSIS — H401132 Primary open-angle glaucoma, bilateral, moderate stage: Secondary | ICD-10-CM | POA: Diagnosis not present

## 2019-06-03 DIAGNOSIS — Z23 Encounter for immunization: Secondary | ICD-10-CM | POA: Diagnosis not present

## 2019-08-31 DIAGNOSIS — D1801 Hemangioma of skin and subcutaneous tissue: Secondary | ICD-10-CM | POA: Diagnosis not present

## 2019-08-31 DIAGNOSIS — L812 Freckles: Secondary | ICD-10-CM | POA: Diagnosis not present

## 2019-08-31 DIAGNOSIS — Z85820 Personal history of malignant melanoma of skin: Secondary | ICD-10-CM | POA: Diagnosis not present

## 2019-08-31 DIAGNOSIS — L723 Sebaceous cyst: Secondary | ICD-10-CM | POA: Diagnosis not present

## 2019-08-31 DIAGNOSIS — L821 Other seborrheic keratosis: Secondary | ICD-10-CM | POA: Diagnosis not present

## 2019-09-06 DIAGNOSIS — M1712 Unilateral primary osteoarthritis, left knee: Secondary | ICD-10-CM | POA: Diagnosis not present

## 2019-10-06 DIAGNOSIS — M1712 Unilateral primary osteoarthritis, left knee: Secondary | ICD-10-CM | POA: Diagnosis not present

## 2019-10-06 DIAGNOSIS — M1711 Unilateral primary osteoarthritis, right knee: Secondary | ICD-10-CM | POA: Diagnosis not present

## 2019-10-06 DIAGNOSIS — M7061 Trochanteric bursitis, right hip: Secondary | ICD-10-CM | POA: Diagnosis not present

## 2019-10-12 DIAGNOSIS — R350 Frequency of micturition: Secondary | ICD-10-CM | POA: Diagnosis not present

## 2019-10-16 ENCOUNTER — Ambulatory Visit: Payer: Medicare Other | Attending: Internal Medicine

## 2019-10-16 DIAGNOSIS — Z23 Encounter for immunization: Secondary | ICD-10-CM | POA: Insufficient documentation

## 2019-10-16 NOTE — Progress Notes (Signed)
   Covid-[facility] Vaccination Clinic  Name:  Emily Shelton    MRN: [id number] DOB: 1940-12-06  10/16/2019  Emily Shelton was observed post Covid-19 immunization for 15 minutes without incidence. She was provided with Vaccine Information Sheet and instruction to access the V-Safe system.   Emily Shelton was instructed to call 911 with any severe reactions post vaccine: Marland Kitchen Difficulty breathing  . Swelling of your face and throat  . A fast heartbeat  . A bad rash all over your body  . Dizziness and weakness    Immunizations Administered    Name Date Dose VIS Date Route   Pfizer COVID-19 Vaccine 10/16/2019 11:37 AM 0.3 mL 09/10/2019 Intramuscular   Manufacturer: [place]   Lot: [id number]   [place]: [id number]

## 2019-10-23 DIAGNOSIS — R350 Frequency of micturition: Secondary | ICD-10-CM | POA: Diagnosis not present

## 2019-10-23 DIAGNOSIS — R300 Dysuria: Secondary | ICD-10-CM | POA: Diagnosis not present

## 2019-10-27 DIAGNOSIS — Z85820 Personal history of malignant melanoma of skin: Secondary | ICD-10-CM | POA: Diagnosis not present

## 2019-10-27 DIAGNOSIS — I10 Essential (primary) hypertension: Secondary | ICD-10-CM | POA: Diagnosis not present

## 2019-10-27 DIAGNOSIS — Z0000 Encounter for general adult medical examination without abnormal findings: Secondary | ICD-10-CM | POA: Diagnosis not present

## 2019-10-27 DIAGNOSIS — Z8639 Personal history of other endocrine, nutritional and metabolic disease: Secondary | ICD-10-CM | POA: Diagnosis not present

## 2019-10-27 DIAGNOSIS — R3915 Urgency of urination: Secondary | ICD-10-CM | POA: Diagnosis not present

## 2019-10-27 DIAGNOSIS — Z1389 Encounter for screening for other disorder: Secondary | ICD-10-CM | POA: Diagnosis not present

## 2019-10-30 ENCOUNTER — Other Ambulatory Visit: Payer: Self-pay | Admitting: Internal Medicine

## 2019-10-30 DIAGNOSIS — Z1231 Encounter for screening mammogram for malignant neoplasm of breast: Secondary | ICD-10-CM

## 2019-11-03 ENCOUNTER — Ambulatory Visit: Payer: Medicare Other | Attending: Internal Medicine

## 2019-11-03 ENCOUNTER — Ambulatory Visit: Payer: Medicare Other

## 2019-11-03 DIAGNOSIS — Z23 Encounter for immunization: Secondary | ICD-10-CM | POA: Insufficient documentation

## 2019-11-03 NOTE — Progress Notes (Signed)
   Covid-19 Vaccination Clinic  Name:  Emily Shelton    MRN: [id number] DOB: Jul 19, 1941  11/03/2019  [name] was observed post Covid-19 immunization for 15 minutes without incidence. She was provided with Vaccine Information Sheet and instruction to access the V-Safe system.   [name] was instructed to call 911 with any severe reactions post vaccine: Marland Kitchen Difficulty breathing  . Swelling of your face and throat  . A fast heartbeat  . A bad rash all over your body  . Dizziness and weakness    Immunizations Administered    Name Date Dose VIS Date Route   Pfizer COVID-19 Vaccine 11/03/2019 11:14 AM 0.3 mL 09/10/2019 Intramuscular   Manufacturer: [place]   Lot: [id number]   [place]: [id number]

## 2019-11-10 DIAGNOSIS — M1711 Unilateral primary osteoarthritis, right knee: Secondary | ICD-10-CM | POA: Diagnosis not present

## 2019-11-10 DIAGNOSIS — M1712 Unilateral primary osteoarthritis, left knee: Secondary | ICD-10-CM | POA: Diagnosis not present

## 2019-11-17 DIAGNOSIS — M25512 Pain in left shoulder: Secondary | ICD-10-CM | POA: Diagnosis not present

## 2019-11-17 DIAGNOSIS — M1712 Unilateral primary osteoarthritis, left knee: Secondary | ICD-10-CM | POA: Diagnosis not present

## 2019-11-17 DIAGNOSIS — M1711 Unilateral primary osteoarthritis, right knee: Secondary | ICD-10-CM | POA: Diagnosis not present

## 2019-11-17 DIAGNOSIS — M25511 Pain in right shoulder: Secondary | ICD-10-CM | POA: Diagnosis not present

## 2019-11-24 DIAGNOSIS — M1712 Unilateral primary osteoarthritis, left knee: Secondary | ICD-10-CM | POA: Diagnosis not present

## 2019-11-24 DIAGNOSIS — M1711 Unilateral primary osteoarthritis, right knee: Secondary | ICD-10-CM | POA: Diagnosis not present

## 2019-12-27 DIAGNOSIS — M1712 Unilateral primary osteoarthritis, left knee: Secondary | ICD-10-CM | POA: Diagnosis not present

## 2019-12-27 DIAGNOSIS — M1711 Unilateral primary osteoarthritis, right knee: Secondary | ICD-10-CM | POA: Diagnosis not present

## 2019-12-28 DIAGNOSIS — Z111 Encounter for screening for respiratory tuberculosis: Secondary | ICD-10-CM | POA: Diagnosis not present

## 2020-01-06 DIAGNOSIS — I10 Essential (primary) hypertension: Secondary | ICD-10-CM | POA: Diagnosis not present

## 2020-01-06 DIAGNOSIS — M545 Low back pain: Secondary | ICD-10-CM | POA: Diagnosis not present

## 2020-01-06 DIAGNOSIS — N3281 Overactive bladder: Secondary | ICD-10-CM | POA: Diagnosis not present

## 2020-01-06 DIAGNOSIS — Z780 Asymptomatic menopausal state: Secondary | ICD-10-CM | POA: Diagnosis not present

## 2020-01-06 DIAGNOSIS — G8929 Other chronic pain: Secondary | ICD-10-CM | POA: Diagnosis not present

## 2020-01-06 DIAGNOSIS — Z87891 Personal history of nicotine dependence: Secondary | ICD-10-CM | POA: Diagnosis not present

## 2020-03-20 ENCOUNTER — Ambulatory Visit: Payer: Medicare Other

## 2020-06-14 DIAGNOSIS — H401132 Primary open-angle glaucoma, bilateral, moderate stage: Secondary | ICD-10-CM | POA: Diagnosis not present

## 2020-06-16 DIAGNOSIS — N3281 Overactive bladder: Secondary | ICD-10-CM | POA: Diagnosis not present

## 2020-06-16 DIAGNOSIS — R351 Nocturia: Secondary | ICD-10-CM | POA: Diagnosis not present

## 2020-06-16 DIAGNOSIS — R350 Frequency of micturition: Secondary | ICD-10-CM | POA: Diagnosis not present

## 2020-06-16 DIAGNOSIS — R3915 Urgency of urination: Secondary | ICD-10-CM | POA: Diagnosis not present

## 2020-06-30 DIAGNOSIS — Z23 Encounter for immunization: Secondary | ICD-10-CM | POA: Diagnosis not present

## 2020-06-30 DIAGNOSIS — I10 Essential (primary) hypertension: Secondary | ICD-10-CM | POA: Diagnosis not present

## 2020-06-30 DIAGNOSIS — Z8639 Personal history of other endocrine, nutritional and metabolic disease: Secondary | ICD-10-CM | POA: Diagnosis not present

## 2020-07-07 DIAGNOSIS — I10 Essential (primary) hypertension: Secondary | ICD-10-CM | POA: Diagnosis not present

## 2020-07-07 DIAGNOSIS — N3281 Overactive bladder: Secondary | ICD-10-CM | POA: Diagnosis not present

## 2020-07-07 DIAGNOSIS — Z87891 Personal history of nicotine dependence: Secondary | ICD-10-CM | POA: Diagnosis not present

## 2020-07-14 DIAGNOSIS — N3281 Overactive bladder: Secondary | ICD-10-CM | POA: Diagnosis not present

## 2020-07-14 DIAGNOSIS — R351 Nocturia: Secondary | ICD-10-CM | POA: Diagnosis not present

## 2020-07-14 DIAGNOSIS — R350 Frequency of micturition: Secondary | ICD-10-CM | POA: Diagnosis not present

## 2020-07-14 DIAGNOSIS — N3941 Urge incontinence: Secondary | ICD-10-CM | POA: Diagnosis not present

## 2020-07-14 DIAGNOSIS — R3915 Urgency of urination: Secondary | ICD-10-CM | POA: Diagnosis not present

## 2020-07-28 DIAGNOSIS — H1033 Unspecified acute conjunctivitis, bilateral: Secondary | ICD-10-CM | POA: Diagnosis not present

## 2020-08-04 DIAGNOSIS — H2512 Age-related nuclear cataract, left eye: Secondary | ICD-10-CM | POA: Diagnosis not present

## 2020-08-22 DIAGNOSIS — Z881 Allergy status to other antibiotic agents status: Secondary | ICD-10-CM | POA: Diagnosis not present

## 2020-08-22 DIAGNOSIS — H2512 Age-related nuclear cataract, left eye: Secondary | ICD-10-CM | POA: Diagnosis not present

## 2020-08-22 DIAGNOSIS — Z79899 Other long term (current) drug therapy: Secondary | ICD-10-CM | POA: Diagnosis not present

## 2020-08-22 DIAGNOSIS — Z882 Allergy status to sulfonamides status: Secondary | ICD-10-CM | POA: Diagnosis not present

## 2020-08-22 DIAGNOSIS — T7840XA Allergy, unspecified, initial encounter: Secondary | ICD-10-CM | POA: Diagnosis not present

## 2020-08-22 DIAGNOSIS — I10 Essential (primary) hypertension: Secondary | ICD-10-CM | POA: Diagnosis not present

## 2020-08-22 DIAGNOSIS — Z9841 Cataract extraction status, right eye: Secondary | ICD-10-CM | POA: Diagnosis not present

## 2020-08-22 DIAGNOSIS — Z91040 Latex allergy status: Secondary | ICD-10-CM | POA: Diagnosis not present

## 2020-08-22 DIAGNOSIS — Z87891 Personal history of nicotine dependence: Secondary | ICD-10-CM | POA: Diagnosis not present

## 2020-08-22 DIAGNOSIS — Z961 Presence of intraocular lens: Secondary | ICD-10-CM | POA: Diagnosis not present
